# Patient Record
Sex: Male | Born: 1950
Health system: Southern US, Community
[De-identification: ages and names within clinical notes are randomized; demographics above are authoritative.]

## PROBLEM LIST (undated history)

## (undated) DIAGNOSIS — I10 Essential (primary) hypertension: Secondary | ICD-10-CM

## (undated) DIAGNOSIS — E079 Disorder of thyroid, unspecified: Secondary | ICD-10-CM

## (undated) HISTORY — DX: Disorder of thyroid, unspecified: E07.9

## (undated) HISTORY — PX: OTHER SURGICAL HISTORY: SHX169

## (undated) HISTORY — DX: Essential (primary) hypertension: I10

---

## 2009-09-07 ENCOUNTER — Ambulatory Visit: Payer: Self-pay | Admitting: Gastroenterology

## 2010-04-16 ENCOUNTER — Ambulatory Visit: Payer: Self-pay | Admitting: Family Medicine

## 2014-06-14 DIAGNOSIS — S0181XA Laceration without foreign body of other part of head, initial encounter: Secondary | ICD-10-CM | POA: Diagnosis not present

## 2014-06-30 DIAGNOSIS — I10 Essential (primary) hypertension: Secondary | ICD-10-CM | POA: Diagnosis not present

## 2014-06-30 DIAGNOSIS — Z23 Encounter for immunization: Secondary | ICD-10-CM | POA: Diagnosis not present

## 2014-07-28 DIAGNOSIS — J324 Chronic pansinusitis: Secondary | ICD-10-CM | POA: Diagnosis not present

## 2014-09-08 DIAGNOSIS — S00211A Abrasion of right eyelid and periocular area, initial encounter: Secondary | ICD-10-CM | POA: Diagnosis not present

## 2015-01-28 DIAGNOSIS — I10 Essential (primary) hypertension: Secondary | ICD-10-CM | POA: Insufficient documentation

## 2015-01-29 ENCOUNTER — Encounter: Payer: Self-pay | Admitting: Family Medicine

## 2015-01-29 ENCOUNTER — Ambulatory Visit (INDEPENDENT_AMBULATORY_CARE_PROVIDER_SITE_OTHER): Payer: Medicare Other | Admitting: Family Medicine

## 2015-01-29 VITALS — BP 133/78 | HR 96 | Temp 98.9°F | Ht 62.5 in | Wt 142.0 lb

## 2015-01-29 DIAGNOSIS — I10 Essential (primary) hypertension: Secondary | ICD-10-CM | POA: Diagnosis not present

## 2015-01-29 MED ORDER — AMLODIPINE BESYLATE 5 MG PO TABS: 5.0000 mg | ORAL_TABLET | Freq: Every day | ORAL | Status: DC

## 2015-01-29 MED ORDER — BENAZEPRIL HCL 40 MG PO TABS: 40.0000 mg | ORAL_TABLET | Freq: Every day | ORAL | Status: DC

## 2015-01-29 NOTE — Assessment & Plan Note (Signed)
The current medical regimen is effective;  continue present plan and medications.  

## 2015-01-29 NOTE — Progress Notes (Signed)
   BP 133/78 mmHg  Pulse 96  Temp(Src) 98.9 F (37.2 C)  Ht 5' 2.5" (1.588 m)  Wt 142 lb (64.411 kg)  BMI 25.54 kg/m2  SpO2 95%   Subjective:    Patient ID: Jerry Frey, male    DOB: Oct 31, 1950, 64 y.o.   MRN: [id number]  HPI: Jerry Frey is a 64 y.o. male  Chief Complaint  Patient presents with  . Hypertension  doing well on bp long term, takes meds every day with no side effects  Relevant past medical, surgical, family and social history reviewed and updated as indicated. Interim medical history since our last visit reviewed. Allergies and medications reviewed and updated.  Review of Systems  Constitutional: Negative.   Respiratory: Negative.   Cardiovascular: Negative.     Per HPI unless specifically indicated above     Objective:    BP 133/78 mmHg  Pulse 96  Temp(Src) 98.9 F (37.2 C)  Ht 5' 2.5" (1.588 m)  Wt 142 lb (64.411 kg)  BMI 25.54 kg/m2  SpO2 95%  Wt Readings from Last 3 Encounters:  01/29/15 142 lb (64.411 kg)  07/28/14 151 lb (68.493 kg)    Physical Exam  Constitutional: He is oriented to person, place, and time. He appears well-developed and well-nourished. No distress.  HENT:  Head: Normocephalic and atraumatic.  Right Ear: Hearing normal.  Left Ear: Hearing normal.  Nose: Nose normal.  Eyes: Conjunctivae and lids are normal. Right eye exhibits no discharge. Left eye exhibits no discharge. No scleral icterus.  Cardiovascular: Normal rate, regular rhythm and normal heart sounds.   Pulmonary/Chest: Effort normal and breath sounds normal. No respiratory distress.  Musculoskeletal: Normal range of motion. He exhibits no edema.  Neurological: He is alert and oriented to person, place, and time.  Skin: Skin is intact. No rash noted.  Psychiatric: He has a normal mood and affect. His speech is normal and behavior is normal. Judgment and thought content normal. Cognition and memory are normal.    No results found for this or any  previous visit.    Assessment & Plan:   Problem List Items Addressed This Visit      Cardiovascular and Mediastinum   Hypertension - Primary    The current medical regimen is effective;  continue present plan and medications.       Relevant Medications   amLODipine (NORVASC) 5 MG tablet   benazepril (LOTENSIN) 40 MG tablet   Other Relevant Orders   Basic metabolic panel       Follow up plan: Return for Physical Exam.

## 2015-01-30 LAB — BASIC METABOLIC PANEL
BUN / CREAT RATIO: 18 (ref 10–22)
BUN: 16 mg/dL (ref 8–27)
CHLORIDE: 101 mmol/L (ref 97–108)
CO2: 22 mmol/L (ref 18–29)
CREATININE: 0.91 mg/dL (ref 0.76–1.27)
Calcium: 10 mg/dL (ref 8.6–10.2)
GFR calc Af Amer: 102 mL/min/{1.73_m2} (ref >59)
GFR, EST NON AFRICAN AMERICAN: 88 mL/min/{1.73_m2} (ref >59)
GLUCOSE: 146 mg/dL — AB (ref 65–99)
Potassium: 4.1 mmol/L (ref 3.5–5.2)
Sodium: 142 mmol/L (ref 134–144)

## 2015-04-30 ENCOUNTER — Ambulatory Visit (INDEPENDENT_AMBULATORY_CARE_PROVIDER_SITE_OTHER): Payer: Medicare Other | Admitting: Family Medicine

## 2015-04-30 ENCOUNTER — Encounter: Payer: Self-pay | Admitting: Family Medicine

## 2015-04-30 VITALS — BP 130/88 | HR 102 | Temp 99.6°F | Ht 62.5 in | Wt 141.4 lb

## 2015-04-30 DIAGNOSIS — I10 Essential (primary) hypertension: Secondary | ICD-10-CM | POA: Diagnosis not present

## 2015-04-30 DIAGNOSIS — F5221 Male erectile disorder: Secondary | ICD-10-CM | POA: Insufficient documentation

## 2015-04-30 DIAGNOSIS — Z0001 Encounter for general adult medical examination with abnormal findings: Secondary | ICD-10-CM | POA: Diagnosis not present

## 2015-04-30 DIAGNOSIS — Z125 Encounter for screening for malignant neoplasm of prostate: Secondary | ICD-10-CM

## 2015-04-30 DIAGNOSIS — N400 Enlarged prostate without lower urinary tract symptoms: Secondary | ICD-10-CM | POA: Diagnosis not present

## 2015-04-30 DIAGNOSIS — Z0000 Encounter for general adult medical examination without abnormal findings: Secondary | ICD-10-CM

## 2015-04-30 DIAGNOSIS — F528 Other sexual dysfunction not due to a substance or known physiological condition: Secondary | ICD-10-CM

## 2015-04-30 DIAGNOSIS — R0989 Other specified symptoms and signs involving the circulatory and respiratory systems: Secondary | ICD-10-CM | POA: Diagnosis not present

## 2015-04-30 MED ORDER — ASPIRIN EC 81 MG PO TBEC
81.0000 mg | DELAYED_RELEASE_TABLET | Freq: Every day | ORAL | Status: DC
Start: 2015-04-30 — End: 2016-10-31

## 2015-04-30 NOTE — Assessment & Plan Note (Signed)
The current medical regimen is effective;  continue present plan and medications.  

## 2015-04-30 NOTE — Assessment & Plan Note (Signed)
Discussed care and concern Discussed signs and symptoms of stroke, and calling 911 Aspirin will take every other day is when taking every day lead to much Appointment for carotid ultrasound at  [facility] Further workup pending ultrasound report.

## 2015-04-30 NOTE — Assessment & Plan Note (Signed)
Discuss lifestyle and work  diet  Nutrition Offer meds such as viagra

## 2015-04-30 NOTE — Progress Notes (Signed)
BP 130/88 mmHg  Pulse 102  Temp(Src) 99.6 F (37.6 C)  Ht 5' 2.5" (1.588 m)  Wt 141 lb 6.4 oz (64.139 kg)  BMI 25.43 kg/m2  SpO2 96%   Subjective:    Patient ID: Jerry Frey, male    DOB: [birth year], 65 y.o.   MRN: [id number]  HPI: Jerry Frey is a 64 y.o. male  Chief Complaint  Patient presents with  . Annual Exam   patient all in all doing well blood pressure doing stable has noticed some change in sexual function. Work stable with 7-8 hours of sleep at night nutrition okay. On review no sleep apnea symptoms.  Relevant past medical, surgical, family and social history reviewed and updated as indicated. Interim medical history since our last visit reviewed. Allergies and medications reviewed and updated.  Review of Systems  Constitutional: Negative.   HENT: Negative.   Eyes: Negative.   Respiratory: Negative.   Cardiovascular: Negative.   Gastrointestinal: Negative.   Endocrine: Negative.   Genitourinary: Negative.   Musculoskeletal: Negative.   Skin: Negative.   Allergic/Immunologic: Negative.   Neurological: Negative.   Hematological: Negative.   Psychiatric/Behavioral: Negative.     Per HPI unless specifically indicated above     Objective:    BP 130/88 mmHg  Pulse 102  Temp(Src) 99.6 F (37.6 C)  Ht 5' 2.5" (1.588 m)  Wt 141 lb 6.4 oz (64.139 kg)  BMI 25.43 kg/m2  SpO2 96%  Wt Readings from Last 3 Encounters:  04/30/15 141 lb 6.4 oz (64.139 kg)  01/29/15 142 lb (64.411 kg)  07/28/14 151 lb (68.493 kg)    Physical Exam  Constitutional: He is oriented to person, place, and time. He appears well-developed and well-nourished.  HENT:  Head: Normocephalic and atraumatic.  Right Ear: External ear normal.  Left Ear: External ear normal.  Eyes: Conjunctivae and EOM are normal. Pupils are equal, round, and reactive to light.  Neck: Normal range of motion. Neck supple.  Soft bruits heard right neck  Cardiovascular: Normal rate, regular  rhythm, normal heart sounds and intact distal pulses.   Pulmonary/Chest: Effort normal and breath sounds normal.  Abdominal: Soft. Bowel sounds are normal. There is no splenomegaly or hepatomegaly.  Genitourinary: Rectum normal and penis normal.  Prostate enlarged  Musculoskeletal: Normal range of motion.  Neurological: He is alert and oriented to person, place, and time. He has normal reflexes.  Skin: No rash noted. No erythema.  Psychiatric: He has a normal mood and affect. His behavior is normal. Judgment and thought content normal.    Results for orders placed or performed in visit on 32/20/25  Basic metabolic panel  Result Value Ref Range   Glucose 146 (H) 65 - 99 mg/dL   BUN 16 8 - 27 mg/dL   Creatinine, Ser 0.91 0.76 - 1.27 mg/dL   GFR calc non Af Amer 88 >59 mL/min/1.73   GFR calc Af Amer 102 >59 mL/min/1.73   BUN/Creatinine Ratio 18 10 - 22   Sodium 142 134 - 144 mmol/L   Potassium 4.1 3.5 - 5.2 mmol/L   Chloride 101 97 - 108 mmol/L   CO2 22 18 - 29 mmol/L   Calcium 10.0 8.6 - 10.2 mg/dL      Assessment & Plan:   Problem List Items Addressed This Visit      Cardiovascular and Mediastinum   Hypertension - Primary    The current medical regimen is effective;  continue present plan and medications.  Relevant Medications   aspirin EC 81 MG tablet   Other Relevant Orders   CBC with Differential/Platelet   Lipid panel   Comprehensive metabolic panel   Urinalysis, Routine w reflex microscopic (not at [facility])   TSH     Genitourinary   BPH (benign prostatic hyperplasia)   Relevant Orders   PSA     Other   ED (erectile dysfunction) of non-organic origin    Discuss lifestyle and work  diet  Nutrition Offer meds such as viagra        Relevant Orders   Lipid panel   Right carotid bruit    Discussed care and concern Discussed signs and symptoms of stroke, and calling 911 Aspirin will take every other day is when taking every day lead to  much Appointment for carotid ultrasound at [facility] Further workup pending ultrasound report.      Relevant Orders   Ambulatory referral to Vascular Surgery   Lipid panel   TSH    Other Visit Diagnoses    PE (physical exam), annual        Relevant Orders    CBC with Differential/Platelet    Lipid panel    Comprehensive metabolic panel    PSA    Urinalysis, Routine w reflex microscopic (not at [facility])    TSH       Medicare wellness metrics all discussed and reviewed. Follow up plan: Return in about 6 months (around 10/28/2015), or if symptoms worsen or fail to improve, for Pending workup and routine hypertension follow-up with BMP.Marland Kitchen

## 2015-05-01 LAB — LIPID PANEL
CHOLESTEROL TOTAL: 213 mg/dL — AB (ref 100–199)
Chol/HDL Ratio: 3.7 ratio units (ref 0.0–5.0)
HDL: 57 mg/dL (ref >39)
LDL Calculated: 102 mg/dL — ABNORMAL HIGH (ref 0–99)
Triglycerides: 269 mg/dL — ABNORMAL HIGH (ref 0–149)
VLDL CHOLESTEROL CAL: 54 mg/dL — AB (ref 5–40)

## 2015-05-01 LAB — CBC WITH DIFFERENTIAL/PLATELET
BASOS ABS: 0 10*3/uL (ref 0.0–0.2)
Basos: 1 %
EOS (ABSOLUTE): 0.1 10*3/uL (ref 0.0–0.4)
Eos: 2 %
HEMOGLOBIN: 14.6 g/dL (ref 12.6–17.7)
Hematocrit: 45.7 % (ref 37.5–51.0)
IMMATURE GRANS (ABS): 0 10*3/uL (ref 0.0–0.1)
IMMATURE GRANULOCYTES: 0 %
LYMPHS: 34 %
Lymphocytes Absolute: 1.8 10*3/uL (ref 0.7–3.1)
MCH: 30 pg (ref 26.6–33.0)
MCHC: 31.9 g/dL (ref 31.5–35.7)
MCV: 94 fL (ref 79–97)
MONOCYTES: 7 %
Monocytes Absolute: 0.4 10*3/uL (ref 0.1–0.9)
NEUTROS ABS: 2.9 10*3/uL (ref 1.4–7.0)
NEUTROS PCT: 56 %
Platelets: 144 10*3/uL — ABNORMAL LOW (ref 150–379)
RBC: 4.86 x10E6/uL (ref 4.14–5.80)
RDW: 15 % (ref 12.3–15.4)
WBC: 5.2 10*3/uL (ref 3.4–10.8)

## 2015-05-01 LAB — URINALYSIS, ROUTINE W REFLEX MICROSCOPIC
Bilirubin, UA: NEGATIVE
GLUCOSE, UA: NEGATIVE
Ketones, UA: NEGATIVE
NITRITE UA: NEGATIVE
PH UA: 5.5 (ref 5.0–7.5)
Protein, UA: NEGATIVE
Specific Gravity, UA: 1.02 (ref 1.005–1.030)
Urobilinogen, Ur: 0.2 mg/dL (ref 0.2–1.0)

## 2015-05-01 LAB — COMPREHENSIVE METABOLIC PANEL
ALK PHOS: 40 IU/L (ref 39–117)
ALT: 36 IU/L (ref 0–44)
AST: 29 IU/L (ref 0–40)
Albumin/Globulin Ratio: 1.5 (ref 1.1–2.5)
Albumin: 4.3 g/dL (ref 3.6–4.8)
BUN / CREAT RATIO: 20 (ref 10–22)
BUN: 19 mg/dL (ref 8–27)
Bilirubin Total: 0.5 mg/dL (ref 0.0–1.2)
CHLORIDE: 102 mmol/L (ref 97–108)
CO2: 23 mmol/L (ref 18–29)
CREATININE: 0.96 mg/dL (ref 0.76–1.27)
Calcium: 9.4 mg/dL (ref 8.6–10.2)
GFR calc Af Amer: 95 mL/min/{1.73_m2} (ref >59)
GFR calc non Af Amer: 83 mL/min/{1.73_m2} (ref >59)
GLOBULIN, TOTAL: 2.8 g/dL (ref 1.5–4.5)
Glucose: 112 mg/dL — ABNORMAL HIGH (ref 65–99)
Potassium: 3.8 mmol/L (ref 3.5–5.2)
Sodium: 140 mmol/L (ref 134–144)
Total Protein: 7.1 g/dL (ref 6.0–8.5)

## 2015-05-01 LAB — TSH: TSH: 0.476 u[IU]/mL (ref 0.450–4.500)

## 2015-05-01 LAB — MICROSCOPIC EXAMINATION

## 2015-05-01 LAB — PSA: Prostate Specific Ag, Serum: 1 ng/mL (ref 0.0–4.0)

## 2015-05-04 ENCOUNTER — Encounter: Payer: Self-pay | Admitting: Family Medicine

## 2015-05-13 DIAGNOSIS — R0989 Other specified symptoms and signs involving the circulatory and respiratory systems: Secondary | ICD-10-CM | POA: Diagnosis not present

## 2015-10-28 ENCOUNTER — Ambulatory Visit (INDEPENDENT_AMBULATORY_CARE_PROVIDER_SITE_OTHER): Payer: Medicare Other | Admitting: Family Medicine

## 2015-10-28 ENCOUNTER — Encounter: Payer: Self-pay | Admitting: Family Medicine

## 2015-10-28 VITALS — BP 141/97 | HR 84 | Temp 98.3°F | Ht 63.0 in | Wt 146.0 lb

## 2015-10-28 DIAGNOSIS — I10 Essential (primary) hypertension: Secondary | ICD-10-CM

## 2015-10-28 NOTE — Assessment & Plan Note (Signed)
Poor control today patient will be checking at home if staying up will notify us For further better control

## 2015-10-28 NOTE — Progress Notes (Signed)
BP 141/97 mmHg  Pulse 84  Temp(Src) 98.3 F (36.8 C)  Ht 5\' 3"  (1.6 m)  Wt 146 lb (66.225 kg)  BMI 25.87 kg/m2  SpO2 99%   Subjective:    Patient ID: Jerry Frey, male    DOB: [birth year], 65 y.o.   MRN: [id number]  HPI: Jerry Frey is a 65 y.o. male  Chief Complaint  Patient presents with  . 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 Interim medical history since our last visit reviewed. Allergies and medications reviewed and updated.  Review of Systems  Constitutional: Negative.   Respiratory: Negative.   Cardiovascular: Negative.     Per HPI unless specifically indicated above     Objective:    BP 141/97 mmHg  Pulse 84  Temp(Src) 98.3 F (36.8 C)  Ht 5\' 3"  (1.6 m)  Wt 146 lb (66.225 kg)  BMI 25.87 kg/m2  SpO2 99%  Wt Readings from Last 3 Encounters:  10/28/15 146 lb (66.225 kg)  04/30/15 141 lb 6.4 oz (64.139 kg)  01/29/15 142 lb (64.411 kg)    Physical Exam  Constitutional: He is oriented to person, place, and time. He appears well-developed and well-nourished. No distress.  HENT:  Head: Normocephalic and atraumatic.  Right Ear: Hearing normal.  Left Ear: Hearing normal.  Nose: Nose normal.  Eyes: Conjunctivae and lids are normal. Right eye exhibits no discharge. Left eye exhibits no discharge. No scleral icterus.  Cardiovascular: Normal rate, regular rhythm and normal heart sounds.   Pulmonary/Chest: Effort normal and breath  sounds normal. No respiratory distress.  Musculoskeletal: Normal range of motion.  Neurological: He is alert and oriented to person, place, and time.  Skin: Skin is intact. No rash noted.  Psychiatric: He has a normal mood and affect. His speech is normal and behavior is normal. Judgment and thought content normal. Cognition and memory are normal.    Results for orders placed or performed in visit on 04/30/15  Microscopic Examination  Result Value Ref Range   WBC, UA 6-10 (A) 0 -  5 /hpf   RBC, UA 0-2 0 -  2 /hpf   Epithelial Cells (non renal) 0-10 0 - 10 /hpf   Bacteria, UA Few None seen/Few  CBC with Differential/Platelet  Result Value Ref Range   WBC 5.2 3.4 - 10.8 x10E3/uL   RBC 4.86 4.14 - 5.80 x10E6/uL   Hemoglobin 14.6 12.6 - 17.7 g/dL   Hematocrit 45.7 37.5 - 51.0 %   MCV 94 79 - 97 fL   MCH 30.0 26.6 - 33.0 pg   MCHC 31.9 31.5 - 35.7 g/dL   RDW 15.0 12.3 - 15.4 %   Platelets 144 (L) 150 - 379 x10E3/uL   Neutrophils 56 %   Lymphs 34 %   Monocytes 7 %   Eos 2 %   Basos 1 %   Neutrophils Absolute 2.9 1.4 - 7.0 x10E3/uL   Lymphocytes Absolute 1.8 0.7 - 3.1 x10E3/uL   Monocytes Absolute 0.4 0.1 - 0.9 x10E3/uL  EOS (ABSOLUTE) 0.1 0.0 - 0.4 x10E3/uL   Basophils Absolute 0.0 0.0 - 0.2 x10E3/uL   Immature Granulocytes 0 %   Immature Grans (Abs) 0.0 0.0 - 0.1 x10E3/uL  Lipid panel  Result Value Ref Range   Cholesterol, Total 213 (H) 100 - 199 mg/dL   Triglycerides 269 (H) 0 - 149 mg/dL   HDL 57 >39 mg/dL   VLDL Cholesterol Cal 54 (H) 5 - 40 mg/dL   LDL Calculated 102 (H) 0 - 99 mg/dL   Chol/HDL Ratio 3.7 0.0 - 5.0 ratio units  Comprehensive metabolic panel  Result Value Ref Range   Glucose 112 (H) 65 - 99 mg/dL   BUN 19 8 - 27 mg/dL   Creatinine, Ser 0.96 0.76 - 1.27 mg/dL   GFR calc non Af Amer 83 >59 mL/min/1.73   GFR calc Af Amer 95 >59 mL/min/1.73   BUN/Creatinine Ratio 20 10 - 22   Sodium 140 134 - 144 mmol/L   Potassium 3.8 3.5 - 5.2 mmol/L   Chloride 102  97 - 108 mmol/L   CO2 23 18 - 29 mmol/L   Calcium 9.4 8.6 - 10.2 mg/dL   Total Protein 7.1 6.0 - 8.5 g/dL   Albumin 4.3 3.6 - 4.8 g/dL   Globulin, Total 2.8 1.5 - 4.5 g/dL   Albumin/Globulin Ratio 1.5 1.1 - 2.5   Bilirubin Total 0.5 0.0 - 1.2 mg/dL   Alkaline Phosphatase 40 39 - 117 IU/L   AST 29 0 - 40 IU/L   ALT 36 0 - 44 IU/L  PSA  Result Value Ref Range   Prostate Specific Ag, Serum 1.0 0.0 - 4.0 ng/mL  Urinalysis, Routine w reflex microscopic (not at [facility])  Result Value Ref Range   Specific Gravity, UA 1.020 1.005 - 1.030   pH, UA 5.5 5.0 - 7.5   Color, UA Yellow Yellow   Appearance Ur Clear Clear   Leukocytes, UA Trace (A) Negative   Protein, UA Negative Negative/Trace   Glucose, UA Negative Negative   Ketones, UA Negative Negative   RBC, UA Trace (A) Negative   Bilirubin, UA Negative Negative   Urobilinogen, Ur 0.2 0.2 - 1.0 mg/dL   Nitrite, UA Negative Negative   Microscopic Examination See below:   TSH  Result Value Ref Range   TSH 0.476 0.450 - 4.500 uIU/mL      Assessment & Plan:   Problem List Items Addressed This Visit      Cardiovascular and Mediastinum   Hypertension - Primary    Poor control today patient will be checking at home if staying up will notify us For further better control      Relevant Orders   Basic metabolic panel      Musculoskeletal shoulder area pain discussed care and treatment exercises Follow up plan: Return in about 6 months (around 04/29/2016) for Physical Exam.

## 2015-10-29 ENCOUNTER — Encounter: Payer: Self-pay | Admitting: Family Medicine

## 2015-10-29 LAB — BASIC METABOLIC PANEL
BUN / CREAT RATIO: 23 — AB (ref 10–22)
BUN: 21 mg/dL (ref 8–27)
CHLORIDE: 99 mmol/L (ref 96–106)
CO2: 23 mmol/L (ref 18–29)
Calcium: 9.1 mg/dL (ref 8.6–10.2)
Creatinine, Ser: 0.93 mg/dL (ref 0.76–1.27)
GFR, EST AFRICAN AMERICAN: 99 mL/min/{1.73_m2} (ref >59)
GFR, EST NON AFRICAN AMERICAN: 85 mL/min/{1.73_m2} (ref >59)
Glucose: 94 mg/dL (ref 65–99)
POTASSIUM: 4 mmol/L (ref 3.5–5.2)
Sodium: 139 mmol/L (ref 134–144)

## 2016-04-13 ENCOUNTER — Encounter (INDEPENDENT_AMBULATORY_CARE_PROVIDER_SITE_OTHER): Payer: Self-pay

## 2016-04-21 ENCOUNTER — Other Ambulatory Visit: Payer: Self-pay | Admitting: Family Medicine

## 2016-04-21 DIAGNOSIS — I10 Essential (primary) hypertension: Secondary | ICD-10-CM

## 2016-05-02 ENCOUNTER — Ambulatory Visit (INDEPENDENT_AMBULATORY_CARE_PROVIDER_SITE_OTHER): Payer: Medicare Other | Admitting: Family Medicine

## 2016-05-02 ENCOUNTER — Encounter: Payer: Self-pay | Admitting: Family Medicine

## 2016-05-02 VITALS — BP 128/78 | HR 96 | Temp 98.4°F | Ht 63.3 in | Wt 147.0 lb

## 2016-05-02 DIAGNOSIS — Z0000 Encounter for general adult medical examination without abnormal findings: Secondary | ICD-10-CM | POA: Diagnosis not present

## 2016-05-02 DIAGNOSIS — N400 Enlarged prostate without lower urinary tract symptoms: Secondary | ICD-10-CM | POA: Diagnosis not present

## 2016-05-02 DIAGNOSIS — I10 Essential (primary) hypertension: Secondary | ICD-10-CM

## 2016-05-02 DIAGNOSIS — Z1159 Encounter for screening for other viral diseases: Secondary | ICD-10-CM | POA: Diagnosis not present

## 2016-05-02 DIAGNOSIS — Z23 Encounter for immunization: Secondary | ICD-10-CM | POA: Diagnosis not present

## 2016-05-02 LAB — URINALYSIS, ROUTINE W REFLEX MICROSCOPIC
BILIRUBIN UA: NEGATIVE
GLUCOSE, UA: NEGATIVE
KETONES UA: NEGATIVE
Leukocytes, UA: NEGATIVE
Nitrite, UA: NEGATIVE
PH UA: 6 (ref 5.0–7.5)
PROTEIN UA: NEGATIVE
SPEC GRAV UA: 1.01 (ref 1.005–1.030)
UUROB: 0.2 mg/dL (ref 0.2–1.0)

## 2016-05-02 LAB — MICROSCOPIC EXAMINATION
BACTERIA UA: NONE SEEN
Epithelial Cells (non renal): NONE SEEN /hpf (ref 0–10)
RBC, UA: NONE SEEN /hpf (ref 0–?)
WBC UA: NONE SEEN /HPF (ref 0–?)

## 2016-05-02 MED ORDER — AMLODIPINE BESYLATE 5 MG PO TABS: 5.0000 mg | ORAL_TABLET | Freq: Every day | ORAL | 4 refills | Status: DC

## 2016-05-02 MED ORDER — BENAZEPRIL HCL 40 MG PO TABS: 40.0000 mg | ORAL_TABLET | Freq: Every day | ORAL | 4 refills | Status: DC

## 2016-05-02 NOTE — Addendum Note (Signed)
Addended by: Wynn Maudlin on: 05/02/2016 02:44 PM   Modules accepted: Orders

## 2016-05-02 NOTE — Assessment & Plan Note (Signed)
The current medical regimen is effective;  continue present plan and medications.  

## 2016-05-02 NOTE — Progress Notes (Signed)
BP 128/78 (BP Location: Right Arm, Cuff Size: Small)   Pulse 96   Temp 98.4 F (36.9 C)   Ht 5' 3.3" (1.608 m)   Wt 147 lb (66.7 kg)   SpO2 97%   BMI 25.79 kg/m    Subjective:    Patient ID: Jerry Frey, male    DOB: 1951/03/03, 65 y.o.   MRN: [id number]  HPI: Jerry Frey is a 65 y.o. male  Chief Complaint  Patient presents with  . Annual Exam  AWV metrics met Patient all in all doing well taking blood pressure medications without problems Is concerned has some fatigue gets 7-8 hours of sleep at night eats reasonable diet and no extra stress. Good sleep with no signs or symptoms of sleep apnea. Relevant past medical, surgical, family and social history reviewed and updated as indicated. Interim medical history since our last visit reviewed. Allergies and medications reviewed and updated.  Review of Systems  Constitutional: Positive for fatigue. Negative for activity change, diaphoresis and fever.  HENT: Negative.   Eyes: Negative.   Respiratory: Negative.   Cardiovascular: Negative.   Gastrointestinal: Negative.   Endocrine: Negative.   Genitourinary: Negative.   Musculoskeletal: Negative.   Skin: Negative.   Allergic/Immunologic: Negative.   Neurological: Negative.   Hematological: Negative.   Psychiatric/Behavioral: Negative.     Per HPI unless specifically indicated above     Objective:    BP 128/78 (BP Location: Right Arm, Cuff Size: Small)   Pulse 96   Temp 98.4 F (36.9 C)   Ht 5' 3.3" (1.608 m)   Wt 147 lb (66.7 kg)   SpO2 97%   BMI 25.79 kg/m   Wt Readings from Last 3 Encounters:  05/02/16 147 lb (66.7 kg)  10/28/15 146 lb (66.2 kg)  04/30/15 141 lb 6.4 oz (64.1 kg)    Physical Exam  Constitutional: He is oriented to person, place, and time. He appears well-developed and well-nourished.  HENT:  Head: Normocephalic and atraumatic.  Right Ear: External ear normal.  Left Ear: External ear normal.  Eyes: Conjunctivae and EOM are  normal. Pupils are equal, round, and reactive to light.  Neck: Normal range of motion. Neck supple.  Cardiovascular: Normal rate, regular rhythm, normal heart sounds and intact distal pulses.   Pulmonary/Chest: Effort normal and breath sounds normal.  Abdominal: Soft. Bowel sounds are normal. There is no splenomegaly or hepatomegaly.  Genitourinary: Rectum normal and penis normal.  Genitourinary Comments: Prostate enlarged  Musculoskeletal: Normal range of motion.  Neurological: He is alert and oriented to person, place, and time. He has normal reflexes.  Skin: No rash noted. No erythema.  Psychiatric: He has a normal mood and affect. His behavior is normal. Judgment and thought content normal.    Results for orders placed or performed in visit on 77/41/28  Basic metabolic panel  Result Value Ref Range   Glucose 94 65 - 99 mg/dL   BUN 21 8 - 27 mg/dL   Creatinine, Ser 0.93 0.76 - 1.27 mg/dL   GFR calc non Af Amer 85 >59 mL/min/1.73   GFR calc Af Amer 99 >59 mL/min/1.73   BUN/Creatinine Ratio 23 (H) 10 - 22   Sodium 139 134 - 144 mmol/L   Potassium 4.0 3.5 - 5.2 mmol/L   Chloride 99 96 - 106 mmol/L   CO2 23 18 - 29 mmol/L   Calcium 9.1 8.6 - 10.2 mg/dL      Assessment & Plan:   Problem List Items  Addressed This Visit      Cardiovascular and Mediastinum   Hypertension    The current medical regimen is effective;  continue present plan and medications.       Relevant Medications   benazepril (LOTENSIN) 40 MG tablet   amLODipine (NORVASC) 5 MG tablet   Other Relevant Orders   CBC with Differential/Platelet   TSH     Genitourinary   BPH (benign prostatic hyperplasia)    All in all doing well no medications no real BPH symptoms      Relevant Orders   PSA    Other Visit Diagnoses    Immunization due    -  Primary   Relevant Orders   Flu vaccine HIGH DOSE PF (Fluzone High dose) (Completed)   Well adult exam       Relevant Orders   CBC with Differential/Platelet    Comprehensive metabolic panel   Lipid Panel w/o Chol/HDL Ratio   PSA   TSH   Urinalysis, Routine w reflex microscopic (not at [facility])   Need for hepatitis C screening test       Relevant Orders   Hepatitis C Antibody     For fatigue will check blood work and review  Follow up plan: Return in about 6 months (around 10/31/2016) for BMP.

## 2016-05-02 NOTE — Patient Instructions (Addendum)
Influenza (Flu) Vaccine (Inactivated or Recombinant):  1. Why get vaccinated? Influenza ("flu") is a contagious disease that spreads around the [place] every year, usually between October and May. Flu is caused by influenza viruses, and is spread mainly by coughing, sneezing, and close contact. Anyone can get flu. Flu strikes suddenly and can last several days. Symptoms vary by age, but can include:  fever/chills  sore throat  muscle aches  fatigue  cough  headache  runny or stuffy nose Flu can also lead to pneumonia and blood infections, and cause diarrhea and seizures in children. If you have a medical condition, such as heart or lung disease, flu can make it worse. Flu is more dangerous for some people. Infants and young children, people 65 years of age and older, pregnant women, and people with certain health conditions or a weakened immune system are at greatest risk. Each year thousands of people in the [place] die from flu, and many more are hospitalized. Flu vaccine can:  keep you from getting flu,  make flu less severe if you do get it, and  keep you from spreading flu to your family and other people. 2. Inactivated and recombinant flu vaccines A dose of flu vaccine is recommended every flu season. Children 6 months through 8 years of age may need two doses during the same flu season. Everyone else needs only one dose each flu season. Some inactivated flu vaccines contain a very small amount of a mercury-based preservative called thimerosal. Studies have not shown thimerosal in vaccines to be harmful, but flu vaccines that do not contain thimerosal are available. There is no live flu virus in flu shots. They cannot cause the flu. There are many flu viruses, and they are always changing. Each year a new flu vaccine is made to protect against three or four viruses that are likely to cause disease in the upcoming flu season. But even when the vaccine doesn't exactly  match these viruses, it may still provide some protection. Flu vaccine cannot prevent:  flu that is caused by a virus not covered by the vaccine, or  illnesses that look like flu but are not. It takes about 2 weeks for protection to develop after vaccination, and protection lasts through the flu season. 3. Some people should not get this vaccine Tell the person who is giving you the vaccine:  If you have any severe, life-threatening allergies. If you ever had a life-threatening allergic reaction after a dose of flu vaccine, or have a severe allergy to any part of this vaccine, you may be advised not to get vaccinated. Most, but not all, types of flu vaccine contain a small amount of egg protein.  If you ever had Guillain-Barre Syndrome (also called GBS). Some people with a history of GBS should not get this vaccine. This should be discussed with your doctor.  If you are not feeling well. It is usually okay to get flu vaccine when you have a mild illness, but you might be asked to come back when you feel better. 4. Risks of a vaccine reaction With any medicine, including vaccines, there is a chance of reactions. These are usually mild and go away on their own, but serious reactions are also possible. Most people who get a flu shot do not have any problems with it. Minor problems following a flu shot include:  soreness, redness, or swelling where the shot was given  hoarseness  sore, red or itchy eyes  cough    fever  aches  headache  itching  fatigue If these problems occur, they usually begin soon after the shot and last 1 or 2 days. More serious problems following a flu shot can include the following:  There may be a small increased risk of Guillain-Barre Syndrome (GBS) after inactivated flu vaccine. This risk has been estimated at 1 or 2 additional cases per million people vaccinated. This is much lower than the risk of severe complications from flu, which can be prevented by  flu vaccine.  Young children who get the flu shot along with pneumococcal vaccine (PCV13) and/or DTaP vaccine at the same time might be slightly more likely to have a seizure caused by fever. Ask your doctor for more information. Tell your doctor if a child who is getting flu vaccine has ever had a seizure. Problems that could happen after any injected vaccine:  People sometimes faint after a medical procedure, including vaccination. Sitting or lying down for about 15 minutes can help prevent fainting, and injuries caused by a fall. Tell your doctor if you feel dizzy, or have vision changes or ringing in the ears.  Some people get severe pain in the shoulder and have difficulty moving the arm where a shot was given. This happens very rarely.  Any medication can cause a severe allergic reaction. Such reactions from a vaccine are very rare, estimated at about 1 in a million doses, and would happen within a few minutes to a few hours after the vaccination. As with any medicine, there is a very remote chance of a vaccine causing a serious injury or death. The safety of vaccines is always being monitored. For more information, visit: www.cdc.gov/vaccinesafety/ 5. What if there is a serious reaction? What should I look for?  Look for anything that concerns you, such as signs of a severe allergic reaction, very high fever, or unusual behavior. Signs of a severe allergic reaction can include hives, swelling of the face and throat, difficulty breathing, a fast heartbeat, dizziness, and weakness. These would start a few minutes to a few hours after the vaccination. What should I do?  If you think it is a severe allergic reaction or other emergency that can't wait, call 9-1-1 and get the person to the nearest hospital. Otherwise, call your doctor.  Reactions should be reported to the Vaccine Adverse Event Reporting System (VAERS). Your doctor should file this report, or you can do it yourself through the  VAERS web site at www.vaers.hhs.gov, or by calling 1-800-822-7967. VAERS does not give medical advice. 6. The National Vaccine Injury Compensation Program The National Vaccine Injury Compensation Program (VICP) is a federal program that was created to compensate people who may have been injured by certain vaccines. Persons who believe they may have been injured by a vaccine can learn about the program and about filing a claim by calling 1-800-338-2382 or visiting the VICP website at www.hrsa.gov/vaccinecompensation. There is a time limit to file a claim for compensation. 7. How can I learn more?  Ask your healthcare provider. He or she can give you the vaccine package insert or suggest other sources of information.  Call your local or state health department.  Contact the Centers for Disease Control and Prevention (CDC):  Call 1-800-232-4636 (1-800-CDC-INFO) or  Visit CDC's website at www.cdc.gov/flu Vaccine Information Statement Inactivated Influenza Vaccine (03/07/2014)   This information is not intended to replace advice given to you by your health care provider. Make sure you discuss any questions you have with   your health care provider.   Document Released: 05/12/2006 Document Revised: 08/08/2014 Document Reviewed: 03/10/2014 Elsevier Interactive Patient Education 2016 Else[employer] Pneumococcal Conjugate Vaccine (PCV13)  1. Why get vaccinated? Vaccination can protect both children and adults from pneumococcal disease. Pneumococcal disease is caused by bacteria that can spread from person to person through close contact. It can cause ear infections, and it can also lead to more serious infections of the:  Lungs (pneumonia),  Blood (bacteremia), and  Covering of the brain and spinal cord (meningitis). Pneumococcal pneumonia is most common among adults. Pneumococcal meningitis can cause deafness and brain damage, and it kills about 1 child in 10 who get it. Anyone can get  pneumococcal disease, but children under 2 years of age and adults 65 years and older, people with certain medical conditions, and cigarette smokers are at the highest risk. Before there was a vaccine, the [place] saw:  more than 700 cases of meningitis,  about 13,000 blood infections,  about 5 million ear infections, and  about 200 deaths in children under 5 each year from pneumococcal disease. Since vaccine became available, severe pneumococcal disease in these children has fallen by 88%. About 18,000 older adults die of pneumococcal disease each year in the [place]. Treatment of pneumococcal infections with penicillin and other drugs is not as effective as it used to be, because some strains of the disease have become resistant to these drugs. This makes prevention of the disease, through vaccination, even more important. 2. PCV13 vaccine Pneumococcal conjugate vaccine (called PCV13) protects against 13 types of pneumococcal bacteria. PCV13 is routinely given to children at 2, 4, 6, and 12-15 months of age. It is also recommended for children and adults 2 to 64 years of age with certain health conditions, and for all adults 65 years of age and older. Your doctor can give you details. 3. Some people should not get this vaccine Anyone who has ever had a life-threatening allergic reaction to a dose of this vaccine, to an earlier pneumococcal vaccine called PCV7, or to any vaccine containing diphtheria toxoid (for example, DTaP), should not get PCV13. Anyone with a severe allergy to any component of PCV13 should not get the vaccine. Tell your doctor if the person being vaccinated has any severe allergies. If the person scheduled for vaccination is not feeling well, your healthcare provider might decide to reschedule the shot on another day. 4. Risks of a vaccine reaction With any medicine, including vaccines, there is a chance of reactions. These are usually mild and go away on their  own, but serious reactions are also possible. Problems reported following PCV13 varied by age and dose in the series. The most common problems reported among children were:  About half became drowsy after the shot, had a temporary loss of appetite, or had redness or tenderness where the shot was given.  About 1 out of 3 had swelling where the shot was given.  About 1 out of 3 had a mild fever, and about 1 in 20 had a fever over 102.2F.  Up to about 8 out of 10 became fussy or irritable. Adults have reported pain, redness, and swelling where the shot was given; also mild fever, fatigue, headache, chills, or muscle pain. Young children who get PCV13 along with inactivated flu vaccine at the same time may be at increased risk for seizures caused by fever. Ask your doctor for more information. Problems that could happen after any vaccine:  People sometimes faint after   a medical procedure, including vaccination. Sitting or lying down for about 15 minutes can help prevent fainting, and injuries caused by a fall. Tell your doctor if you feel dizzy, or have vision changes or ringing in the ears.  Some older children and adults get severe pain in the shoulder and have difficulty moving the arm where a shot was given. This happens very rarely.  Any medication can cause a severe allergic reaction. Such reactions from a vaccine are very rare, estimated at about 1 in a million doses, and would happen within a few minutes to a few hours after the vaccination. As with any medicine, there is a very small chance of a vaccine causing a serious injury or death. The safety of vaccines is always being monitored. For more information, visit: www.cdc.gov/vaccinesafety/ 5. What if there is a serious reaction? What should I look for?  Look for anything that concerns you, such as signs of a severe allergic reaction, very high fever, or unusual behavior. Signs of a severe allergic reaction can include hives, swelling  of the face and throat, difficulty breathing, a fast heartbeat, dizziness, and weakness-usually within a few minutes to a few hours after the vaccination. What should I do?  If you think it is a severe allergic reaction or other emergency that can't wait, call 9-1-1 or get the person to the nearest hospital. Otherwise, call your doctor. Reactions should be reported to the Vaccine Adverse Event Reporting System (VAERS). Your doctor should file this report, or you can do it yourself through the VAERS web site at www.vaers.hhs.gov, or by calling 1-800-822-7967. VAERS does not give medical advice. 6. The National Vaccine Injury Compensation Program The National Vaccine Injury Compensation Program (VICP) is a federal program that was created to compensate people who may have been injured by certain vaccines. Persons who believe they may have been injured by a vaccine can learn about the program and about filing a claim by calling 1-800-338-2382 or visiting the VICP website at www.hrsa.gov/vaccinecompensation. There is a time limit to file a claim for compensation. 7. How can I learn more?  Ask your healthcare provider. He or she can give you the vaccine package insert or suggest other sources of information.  Call your local or state health department.  Contact the Centers for Disease Control and Prevention (CDC):  Call 1-800-232-4636 (1-800-CDC-INFO) or  Visit CDC's website at www.cdc.gov/vaccines Vaccine Information Statement PCV13 Vaccine (06/05/2014)   This information is not intended to replace advice given to you by your health care provider. Make sure you discuss any questions you have with your health care provider.   Document Released: 05/15/2006 Document Revised: 08/08/2014 Document Reviewed: 06/12/2014 Elsevier Interactive Patient Education 2016 Elsevier Inc.  

## 2016-05-02 NOTE — Assessment & Plan Note (Signed)
All in all doing well no medications no real BPH symptoms

## 2016-05-03 ENCOUNTER — Telehealth: Payer: Self-pay | Admitting: Family Medicine

## 2016-05-03 DIAGNOSIS — R7989 Other specified abnormal findings of blood chemistry: Secondary | ICD-10-CM

## 2016-05-03 LAB — COMPREHENSIVE METABOLIC PANEL
ALK PHOS: 40 IU/L (ref 39–117)
ALT: 31 IU/L (ref 0–44)
AST: 37 IU/L (ref 0–40)
Albumin/Globulin Ratio: 1.4 (ref 1.2–2.2)
Albumin: 4.2 g/dL (ref 3.6–4.8)
BUN/Creatinine Ratio: 20 (ref 10–24)
BUN: 19 mg/dL (ref 8–27)
Bilirubin Total: 0.3 mg/dL (ref 0.0–1.2)
CO2: 26 mmol/L (ref 18–29)
CREATININE: 0.97 mg/dL (ref 0.76–1.27)
Calcium: 9 mg/dL (ref 8.6–10.2)
Chloride: 100 mmol/L (ref 96–106)
GFR calc Af Amer: 94 mL/min/{1.73_m2} (ref >59)
GFR calc non Af Amer: 81 mL/min/{1.73_m2} (ref >59)
GLOBULIN, TOTAL: 3 g/dL (ref 1.5–4.5)
Glucose: 108 mg/dL — ABNORMAL HIGH (ref 65–99)
POTASSIUM: 3.7 mmol/L (ref 3.5–5.2)
SODIUM: 141 mmol/L (ref 134–144)
Total Protein: 7.2 g/dL (ref 6.0–8.5)

## 2016-05-03 LAB — TSH: TSH: 0.291 u[IU]/mL — AB (ref 0.450–4.500)

## 2016-05-03 LAB — CBC WITH DIFFERENTIAL/PLATELET
BASOS ABS: 0 10*3/uL (ref 0.0–0.2)
Basos: 1 %
EOS (ABSOLUTE): 0.2 10*3/uL (ref 0.0–0.4)
EOS: 3 %
HEMATOCRIT: 43 % (ref 37.5–51.0)
Hemoglobin: 14.3 g/dL (ref 12.6–17.7)
Immature Grans (Abs): 0 10*3/uL (ref 0.0–0.1)
Immature Granulocytes: 0 %
Lymphocytes Absolute: 1.3 10*3/uL (ref 0.7–3.1)
Lymphs: 24 %
MCH: 30.6 pg (ref 26.6–33.0)
MCHC: 33.3 g/dL (ref 31.5–35.7)
MCV: 92 fL (ref 79–97)
MONOS ABS: 0.6 10*3/uL (ref 0.1–0.9)
Monocytes: 11 %
Neutrophils Absolute: 3.5 10*3/uL (ref 1.4–7.0)
Neutrophils: 61 %
Platelets: 157 10*3/uL (ref 150–379)
RBC: 4.68 x10E6/uL (ref 4.14–5.80)
RDW: 14.7 % (ref 12.3–15.4)
WBC: 5.5 10*3/uL (ref 3.4–10.8)

## 2016-05-03 LAB — LIPID PANEL W/O CHOL/HDL RATIO
Cholesterol, Total: 208 mg/dL — ABNORMAL HIGH (ref 100–199)
HDL: 55 mg/dL (ref >39)
LDL Calculated: 80 mg/dL (ref 0–99)
Triglycerides: 366 mg/dL — ABNORMAL HIGH (ref 0–149)
VLDL Cholesterol Cal: 73 mg/dL — ABNORMAL HIGH (ref 5–40)

## 2016-05-03 LAB — HEPATITIS C ANTIBODY

## 2016-05-03 LAB — PSA: Prostate Specific Ag, Serum: 0.6 ng/mL (ref 0.0–4.0)

## 2016-05-03 NOTE — Telephone Encounter (Signed)
-----   Message from Wynn Maudlin, [place] sent at 05/03/2016  4:48 PM EDT ----- labs

## 2016-05-03 NOTE — Telephone Encounter (Signed)
Phone call Discussed with patient low TSH patient will come back in 2-4 weeks to recheck TSH

## 2016-05-05 ENCOUNTER — Other Ambulatory Visit: Payer: Medicare Other

## 2016-05-05 ENCOUNTER — Other Ambulatory Visit: Payer: Self-pay

## 2016-05-05 DIAGNOSIS — R7989 Other specified abnormal findings of blood chemistry: Secondary | ICD-10-CM

## 2016-05-05 DIAGNOSIS — R946 Abnormal results of thyroid function studies: Secondary | ICD-10-CM | POA: Diagnosis not present

## 2016-05-06 LAB — TSH: TSH: 0.596 u[IU]/mL (ref 0.450–4.500)

## 2016-05-09 ENCOUNTER — Encounter: Payer: Self-pay | Admitting: Family Medicine

## 2016-10-31 ENCOUNTER — Ambulatory Visit (INDEPENDENT_AMBULATORY_CARE_PROVIDER_SITE_OTHER): Payer: Medicare Other | Admitting: Family Medicine

## 2016-10-31 ENCOUNTER — Encounter: Payer: Self-pay | Admitting: Family Medicine

## 2016-10-31 VITALS — BP 111/74 | HR 86 | Temp 98.2°F | Ht 64.0 in | Wt 150.0 lb

## 2016-10-31 DIAGNOSIS — I10 Essential (primary) hypertension: Secondary | ICD-10-CM | POA: Diagnosis not present

## 2016-10-31 DIAGNOSIS — E079 Disorder of thyroid, unspecified: Secondary | ICD-10-CM | POA: Diagnosis not present

## 2016-10-31 NOTE — Assessment & Plan Note (Signed)
The current medical regimen is effective;  continue present plan and medications.  

## 2016-10-31 NOTE — Progress Notes (Signed)
   BP 111/74 (BP Location: Left Arm, Patient Position: Sitting, Cuff Size: Normal)   Pulse 86   Temp 98.2 F (36.8 C)   Ht 5\' 4"  (1.626 m)   Wt 150 lb (68 kg)   SpO2 95%   BMI 25.75 kg/m    Subjective:    Patient ID: Jerry Frey, male    DOB: 29-Jul-1951, 66 y.o.   MRN: [id number]  HPI: Jerry Frey is a 66 y.o. male  Chief Complaint  Patient presents with  . Follow-up  . Hypertension  . Thyroid Problem   Patient doing well no complaints taking blood pressure medicines without problems or issues no side effects no edema. Thyroid no complaints no issues. We will recheck to confirm staying normal Relevant past medical, surgical, family and social history reviewed and updated as indicated. Interim medical history since our last visit reviewed. Allergies and medications reviewed and updated.  Review of Systems  Constitutional: Negative.   Respiratory: Negative.   Cardiovascular: Negative.     Per HPI unless specifically indicated above     Objective:    BP 111/74 (BP Location: Left Arm, Patient Position: Sitting, Cuff Size: Normal)   Pulse 86   Temp 98.2 F (36.8 C)   Ht 5\' 4"  (1.626 m)   Wt 150 lb (68 kg)   SpO2 95%   BMI 25.75 kg/m   Wt Readings from Last 3 Encounters:  10/31/16 150 lb (68 kg)  05/02/16 147 lb (66.7 kg)  10/28/15 146 lb (66.2 kg)    Physical Exam  Constitutional: He is oriented to person, place, and time. He appears well-developed and well-nourished.  HENT:  Head: Normocephalic and atraumatic.  Eyes: Conjunctivae and EOM are normal.  Neck: Normal range of motion.  Cardiovascular: Normal rate, regular rhythm and normal heart sounds.   Pulmonary/Chest: Effort normal and breath sounds normal.  Musculoskeletal: Normal range of motion.  Neurological: He is alert and oriented to person, place, and time.  Skin: No erythema.  Psychiatric: He has a normal mood and affect. His behavior is normal. Judgment and thought content normal.     Results for orders placed or performed in visit on 05/05/16  TSH  Result Value Ref Range   TSH 0.596 0.450 - 4.500 uIU/mL      Assessment & Plan:   Problem List Items Addressed This Visit      Cardiovascular and Mediastinum   Hypertension - Primary    The current medical regimen is effective;  continue present plan and medications.       Relevant Orders   Basic metabolic panel    Other Visit Diagnoses    Thyroid disease       Rechecking TSH today   Relevant Orders   TSH       Follow up plan: Return in about 6 months (around 05/02/2017) for Physical Exam.

## 2016-11-01 LAB — TSH: TSH: 0.007 u[IU]/mL — ABNORMAL LOW (ref 0.450–4.500)

## 2016-11-01 LAB — BASIC METABOLIC PANEL
BUN / CREAT RATIO: 24 (ref 10–24)
BUN: 20 mg/dL (ref 8–27)
CO2: 24 mmol/L (ref 18–29)
CREATININE: 0.84 mg/dL (ref 0.76–1.27)
Calcium: 9.3 mg/dL (ref 8.6–10.2)
Chloride: 102 mmol/L (ref 96–106)
GFR, EST AFRICAN AMERICAN: 105 mL/min/{1.73_m2} (ref >59)
GFR, EST NON AFRICAN AMERICAN: 91 mL/min/{1.73_m2} (ref >59)
Glucose: 131 mg/dL — ABNORMAL HIGH (ref 65–99)
Potassium: 4.2 mmol/L (ref 3.5–5.2)
Sodium: 142 mmol/L (ref 134–144)

## 2016-11-02 ENCOUNTER — Telehealth: Payer: Self-pay | Admitting: Family Medicine

## 2016-11-02 DIAGNOSIS — R7989 Other specified abnormal findings of blood chemistry: Secondary | ICD-10-CM

## 2016-11-02 DIAGNOSIS — R7309 Other abnormal glucose: Secondary | ICD-10-CM

## 2016-11-02 NOTE — Telephone Encounter (Signed)
Phone call Discussed with patient suppressed TSH elevated glucose patient will come back later this month for repeat TSH and thyroid panel. We will also check hemoglobin A1c.

## 2016-11-21 ENCOUNTER — Other Ambulatory Visit: Payer: Medicare Other

## 2016-11-21 DIAGNOSIS — R7309 Other abnormal glucose: Secondary | ICD-10-CM | POA: Diagnosis not present

## 2016-11-21 DIAGNOSIS — R7989 Other specified abnormal findings of blood chemistry: Secondary | ICD-10-CM

## 2016-11-21 DIAGNOSIS — R946 Abnormal results of thyroid function studies: Secondary | ICD-10-CM | POA: Diagnosis not present

## 2016-11-22 ENCOUNTER — Telehealth: Payer: Self-pay | Admitting: Family Medicine

## 2016-11-22 DIAGNOSIS — E0590 Thyrotoxicosis, unspecified without thyrotoxic crisis or storm: Secondary | ICD-10-CM | POA: Insufficient documentation

## 2016-11-22 LAB — T3: T3 TOTAL: 84 ng/dL (ref 71–180)

## 2016-11-22 LAB — TSH: TSH: 0.296 u[IU]/mL — ABNORMAL LOW (ref 0.450–4.500)

## 2016-11-22 LAB — HGB A1C W/O EAG: Hgb A1c MFr Bld: 5.7 % — ABNORMAL HIGH (ref 4.8–5.6)

## 2016-11-22 LAB — T4: T4 TOTAL: 3.9 ug/dL — AB (ref 4.5–12.0)

## 2016-11-22 NOTE — Telephone Encounter (Signed)
Phone call Discussed with patient TSH still suppressed but not as much so will refer to endocrinology to further evaluate for hyperthyroid conditions. Patient indicates he understands and will comply.

## 2017-01-09 DIAGNOSIS — E0590 Thyrotoxicosis, unspecified without thyrotoxic crisis or storm: Secondary | ICD-10-CM | POA: Diagnosis not present

## 2017-01-23 DIAGNOSIS — E038 Other specified hypothyroidism: Secondary | ICD-10-CM | POA: Diagnosis not present

## 2017-03-21 DIAGNOSIS — E221 Hyperprolactinemia: Secondary | ICD-10-CM | POA: Diagnosis not present

## 2017-03-21 DIAGNOSIS — E038 Other specified hypothyroidism: Secondary | ICD-10-CM | POA: Diagnosis not present

## 2017-03-28 DIAGNOSIS — E038 Other specified hypothyroidism: Secondary | ICD-10-CM | POA: Insufficient documentation

## 2017-03-28 DIAGNOSIS — E221 Hyperprolactinemia: Secondary | ICD-10-CM | POA: Diagnosis not present

## 2017-04-13 ENCOUNTER — Ambulatory Visit: Payer: Medicare Other

## 2017-04-19 ENCOUNTER — Other Ambulatory Visit: Payer: Self-pay | Admitting: Internal Medicine

## 2017-04-19 DIAGNOSIS — E221 Hyperprolactinemia: Secondary | ICD-10-CM

## 2017-04-19 DIAGNOSIS — E038 Other specified hypothyroidism: Secondary | ICD-10-CM

## 2017-04-26 ENCOUNTER — Ambulatory Visit
Admission: RE | Admit: 2017-04-26 | Discharge: 2017-04-26 | Disposition: A | Discharge status: Home / Self Care | Payer: Medicare Other | Source: Ambulatory Visit | Attending: Internal Medicine | Admitting: Internal Medicine

## 2017-04-26 DIAGNOSIS — E038 Other specified hypothyroidism: Secondary | ICD-10-CM | POA: Insufficient documentation

## 2017-04-26 DIAGNOSIS — E221 Hyperprolactinemia: Secondary | ICD-10-CM | POA: Insufficient documentation

## 2017-04-26 DIAGNOSIS — H35363 Drusen (degenerative) of macula, bilateral: Secondary | ICD-10-CM | POA: Diagnosis not present

## 2017-04-26 LAB — POCT I-STAT CREATININE: Creatinine, Ser: 0.9 mg/dL (ref 0.61–1.24)

## 2017-04-26 MED ORDER — GADOBENATE DIMEGLUMINE 529 MG/ML IV SOLN
10.0000 mL | Freq: Once | INTRAVENOUS | Status: AC | PRN
  Administered 2017-04-26: 10 mL via INTRAVENOUS

## 2017-04-27 ENCOUNTER — Ambulatory Visit: Payer: Medicare Other

## 2017-05-03 ENCOUNTER — Ambulatory Visit (INDEPENDENT_AMBULATORY_CARE_PROVIDER_SITE_OTHER): Payer: Medicare Other

## 2017-05-03 VITALS — BP 145/88 | HR 84 | Temp 98.3°F | Ht 65.0 in | Wt 151.3 lb

## 2017-05-03 DIAGNOSIS — Z23 Encounter for immunization: Secondary | ICD-10-CM

## 2017-05-03 DIAGNOSIS — Z0000 Encounter for general adult medical examination without abnormal findings: Secondary | ICD-10-CM | POA: Diagnosis not present

## 2017-05-03 NOTE — Progress Notes (Signed)
Subjective:   Jerry Frey is a 66 y.o. male who presents for Medicare Annual/Subsequent preventive examination.  Review of Systems:  Cardiac Risk Factors include: male gender;advanced age (>55men, >16 women);hypertension     Objective:    Vitals: BP (!) 145/88 (BP Location: Left Arm)   Pulse 84   Temp 98.3 F (36.8 C) (Oral)   Ht 5\' 5"  (1.651 m)   Wt 151 lb 4.8 oz (68.6 kg)   BMI 25.18 kg/m   Body mass index is 25.18 kg/m.  Tobacco History  Smoking Status  . Never Smoker  Smokeless Tobacco  . Never Used     Counseling given: Not Answered   Past Medical History:  Diagnosis Date  . Hypertension   . Thyroid disease    Past Surgical History:  Procedure Laterality Date  . L4 compression fracture     History reviewed. No pertinent family history. History  Sexual Activity  . Sexual activity: Not on file    Outpatient Encounter Prescriptions as of 05/03/2017  Medication Sig  . amLODipine (NORVASC) 5 MG tablet Take 1 tablet (5 mg total) by mouth daily.  . benazepril (LOTENSIN) 40 MG tablet Take 1 tablet (40 mg total) by mouth daily.  Marland Kitchen levothyroxine (SYNTHROID, LEVOTHROID) 50 MCG tablet Take by mouth.   No facility-administered encounter medications on file as of 05/03/2017.     Activities of Daily Living In your present state of health, do you have any difficulty performing the following activities: 05/03/2017  Hearing? N  Vision? N  Difficulty concentrating or making decisions? N  Walking or climbing stairs? N  Dressing or bathing? N  Doing errands, shopping? N  Preparing Food and eating ? N  Using the Toilet? N  In the past six months, have you accidently leaked urine? N  Do you have problems with loss of bowel control? N  Managing your Medications? N  Managing your Finances? N  Housekeeping or managing your Housekeeping? N  Some recent data might be hidden    Patient Care Team: Guadalupe Maple, MD as PCP - General (Family Medicine) Hiram Gash, MD as Referring Physician (Orthopedic Surgery) Jill Side, MD (Gastroenterology) Gabriel Carina Betsey Holiday, MD as Physician Assistant (Endocrinology)   Assessment:     Exercise Activities and Dietary recommendations Current Exercise Habits: The patient does not participate in regular exercise at present;The patient has a physically strenous job, but has no regular exercise apart from work., Exercise limited by: None identified  Goals    None     Fall Risk Fall Risk  05/03/2017 05/02/2016 04/30/2015  Falls in the past year? No No No   Depression Screen PHQ 2/9 Scores 05/03/2017 05/02/2016 04/30/2015  PHQ - 2 Score 0 0 0    Cognitive Function     6CIT Screen 05/03/2017  What Year? 0 points  What month? 0 points  What time? 0 points  Count back from 20 0 points  Months in reverse 0 points  Repeat phrase 8 points  Total Score 8    Immunization History  Administered Date(s) Administered  . Influenza, High Dose Seasonal PF 05/02/2016  . Influenza-Unspecified 06/30/2014, 05/20/2015  . Pneumococcal Conjugate-13 05/02/2016  . Pneumococcal Polysaccharide-23 05/03/2017  . Td 09/28/2014   Screening Tests Health Maintenance  Topic Date Due  . INFLUENZA VACCINE  03/01/2017  . PNA vac Low Risk Adult (2 of 2 - PPSV23) 05/02/2017  . COLONOSCOPY  05/05/2017 (Originally 09/07/2014)  . TETANUS/TDAP  09/28/2024  .  Hepatitis C Screening  Completed      Plan:    I have personally reviewed and addressed the Medicare Annual Wellness questionnaire and have noted the following in the patient's chart:  A. Medical and social history B. Use of alcohol, tobacco or illicit drugs  C. Current medications and supplements D. Functional ability and status E.  Nutritional status F.  Physical activity G. Advance directives H. List of other physicians I.  Hospitalizations, surgeries, and ER visits in previous 12 months J.  [place] such as hearing and vision if needed,  cognitive and depression L. Referrals and appointments   In addition, I have reviewed and discussed with patient certain preventive protocols, quality metrics, and best practice recommendations. A written personalized care plan for preventive services as well as general preventive health recommendations were provided to patient.   Signed,  Tyler Aas, LPN Nurse Health Advisor   MD Recommendations: none

## 2017-05-03 NOTE — Patient Instructions (Signed)
Jerry Frey , Thank you for taking time to come for your Medicare Wellness Visit. I appreciate your ongoing commitment to your health goals. Please review the following plan we discussed and let me know if I can assist you in the future.   Screening recommendations/referrals: Colonoscopy: completed 09/16/2009 Recommended yearly ophthalmology/optometry visit for glaucoma screening and checkup Recommended yearly dental visit for hygiene and checkup  Vaccinations: Influenza vaccine: requested on 05/09/2017 at [facility] Pneumococcal vaccine: completed series Tdap vaccine: up to date Shingles vaccine: due, check with your insurance company for coverage  Advanced directives: Advance directive discussed with you today. Even though you declined this today please call our office should you change your mind and we can give you the proper paperwork for you to fill out.  Conditions/risks identified: none  Next appointment: Follow up on 05/09/2017 at 3:00pm with Dr.Crissman. Follow up in one year for your annual wellness exam.   Preventive Care 65 Years and Older, Male Preventive care refers to lifestyle choices and visits with your health care provider that can promote health and wellness. What does preventive care include?  A yearly physical exam. This is also called an annual well check.  Dental exams once or twice a year.  Routine eye exams. Ask your health care provider how often you should have your eyes checked.  Personal lifestyle choices, including:  Daily care of your teeth and gums.  Regular physical activity.  Eating a healthy diet.  Avoiding tobacco and drug use.  Limiting alcohol use.  Practicing safe sex.  Taking low doses of aspirin every day.  Taking vitamin and mineral supplements as recommended by your health care provider. What happens during an annual well check? The services and screenings done by your health care provider during your annual well check will depend on  your age, overall health, lifestyle risk factors, and family history of disease. Counseling  Your health care provider may ask you questions about your:  Alcohol use.  Tobacco use.  Drug use.  Emotional well-being.  Home and relationship well-being.  Sexual activity.  Eating habits.  History of falls.  Memory and ability to understand (cognition).  Work and work Statistician. Screening  You may have the following tests or measurements:  Height, weight, and BMI.  Blood pressure.  Lipid and cholesterol levels. These may be checked every 5 years, or more frequently if you are over 61 years old.  Skin check.  Lung cancer screening. You may have this screening every year starting at age 62 if you have a 30-pack-year history of smoking and currently smoke or have quit within the past 15 years.  Fecal occult blood test (FOBT) of the stool. You may have this test every year starting at age 76.  Flexible sigmoidoscopy or colonoscopy. You may have a sigmoidoscopy every 5 years or a colonoscopy every 10 years starting at age 77.  Prostate cancer screening. Recommendations will vary depending on your family history and other risks.  Hepatitis C blood test.  Hepatitis B blood test.  Sexually transmitted disease (STD) testing.  Diabetes screening. This is done by checking your blood sugar (glucose) after you have not eaten for a while (fasting). You may have this done every 1-3 years.  Abdominal aortic aneurysm (AAA) screening. You may need this if you are a current or former smoker.  Osteoporosis. You may be screened starting at age 78 if you are at high risk. Talk with your health care provider about your test results, treatment options,  and if necessary, the need for more tests. Vaccines  Your health care provider may recommend certain vaccines, such as:  Influenza vaccine. This is recommended every year.  Tetanus, diphtheria, and acellular pertussis (Tdap, Td) vaccine.  You may need a Td booster every 10 years.  Zoster vaccine. You may need this after age 10.  Pneumococcal 13-valent conjugate (PCV13) vaccine. One dose is recommended after age 4.  Pneumococcal polysaccharide (PPSV23) vaccine. One dose is recommended after age 58. Talk to your health care provider about which screenings and vaccines you need and how often you need them. This information is not intended to replace advice given to you by your health care provider. Make sure you discuss any questions you have with your health care provider. Document Released: 08/14/2015 Document Revised: 04/06/2016 Document Reviewed: 05/19/2015 Elsevier Interactive Patient Education  2017 [place] Prevention in the Home Falls can cause injuries. They can happen to people of all ages. There are many things you can do to make your home safe and to help prevent falls. What can I do on the outside of my home?  Regularly fix the edges of walkways and driveways and fix any cracks.  Remove anything that might make you trip as you walk through a door, such as a raised step or threshold.  Trim any bushes or trees on the path to your home.  Use bright outdoor lighting.  Clear any walking paths of anything that might make someone trip, such as rocks or tools.  Regularly check to see if handrails are loose or broken. Make sure that both sides of any steps have handrails.  Any raised decks and porches should have guardrails on the edges.  Have any leaves, snow, or ice cleared regularly.  Use sand or salt on walking paths during winter.  Clean up any spills in your garage right away. This includes oil or grease spills. What can I do in the bathroom?  Use night lights.  Install grab bars by the toilet and in the tub and shower. Do not use towel bars as grab bars.  Use non-skid mats or decals in the tub or shower.  If you need to sit down in the shower, use a plastic, non-slip stool.  Keep the  floor dry. Clean up any water that spills on the floor as soon as it happens.  Remove soap buildup in the tub or shower regularly.  Attach bath mats securely with double-sided non-slip rug tape.  Do not have throw rugs and other things on the floor that can make you trip. What can I do in the bedroom?  Use night lights.  Make sure that you have a light by your bed that is easy to reach.  Do not use any sheets or blankets that are too big for your bed. They should not hang down onto the floor.  Have a firm chair that has side arms. You can use this for support while you get dressed.  Do not have throw rugs and other things on the floor that can make you trip. What can I do in the kitchen?  Clean up any spills right away.  Avoid walking on wet floors.  Keep items that you use a lot in easy-to-reach places.  If you need to reach something above you, use a strong step stool that has a grab bar.  Keep electrical cords out of the way.  Do not use floor polish or wax that makes floors slippery. If  you must use wax, use non-skid floor wax.  Do not have throw rugs and other things on the floor that can make you trip. What can I do with my stairs?  Do not leave any items on the stairs.  Make sure that there are handrails on both sides of the stairs and use them. Fix handrails that are broken or loose. Make sure that handrails are as long as the stairways.  Check any carpeting to make sure that it is firmly attached to the stairs. Fix any carpet that is loose or worn.  Avoid having throw rugs at the top or bottom of the stairs. If you do have throw rugs, attach them to the floor with carpet tape.  Make sure that you have a light switch at the top of the stairs and the bottom of the stairs. If you do not have them, ask someone to add them for you. What else can I do to help prevent falls?  Wear shoes that:  Do not have high heels.  Have rubber bottoms.  Are comfortable and fit  you well.  Are closed at the toe. Do not wear sandals.  If you use a stepladder:  Make sure that it is fully opened. Do not climb a closed stepladder.  Make sure that both sides of the stepladder are locked into place.  Ask someone to hold it for you, if possible.  Clearly mark and make sure that you can see:  Any grab bars or handrails.  First and last steps.  Where the edge of each step is.  Use tools that help you move around (mobility aids) if they are needed. These include:  Canes.  Walkers.  Scooters.  Crutches.  Turn on the lights when you go into a dark area. Replace any light bulbs as soon as they burn out.  Set up your furniture so you have a clear path. Avoid moving your furniture around.  If any of your floors are uneven, fix them.  If there are any pets around you, be aware of where they are.  Review your medicines with your doctor. Some medicines can make you feel dizzy. This can increase your chance of falling. Ask your doctor what other things that you can do to help prevent falls. This information is not intended to replace advice given to you by your health care provider. Make sure you discuss any questions you have with your health care provider. Document Released: 05/14/2009 Document Revised: 12/24/2015 Document Reviewed: 08/22/2014 Elsevier Interactive Patient Education  2017 [employer].

## 2017-05-09 ENCOUNTER — Encounter: Payer: Self-pay | Admitting: Family Medicine

## 2017-05-09 ENCOUNTER — Ambulatory Visit (INDEPENDENT_AMBULATORY_CARE_PROVIDER_SITE_OTHER): Payer: Medicare Other | Admitting: Family Medicine

## 2017-05-09 VITALS — BP 130/86 | HR 91 | Ht 62.99 in | Wt 146.0 lb

## 2017-05-09 DIAGNOSIS — I10 Essential (primary) hypertension: Secondary | ICD-10-CM | POA: Diagnosis not present

## 2017-05-09 DIAGNOSIS — K648 Other hemorrhoids: Secondary | ICD-10-CM | POA: Diagnosis not present

## 2017-05-09 DIAGNOSIS — N400 Enlarged prostate without lower urinary tract symptoms: Secondary | ICD-10-CM | POA: Diagnosis not present

## 2017-05-09 DIAGNOSIS — R0989 Other specified symptoms and signs involving the circulatory and respiratory systems: Secondary | ICD-10-CM | POA: Diagnosis not present

## 2017-05-09 DIAGNOSIS — E0590 Thyrotoxicosis, unspecified without thyrotoxic crisis or storm: Secondary | ICD-10-CM | POA: Diagnosis not present

## 2017-05-09 MED ORDER — HYDROCORTISONE 2.5 % RE CREA: 1.0000 "application " | TOPICAL_CREAM | Freq: Two times a day (BID) | RECTAL | 0 refills | Status: DC

## 2017-05-09 MED ORDER — AMLODIPINE BESYLATE 5 MG PO TABS: 5.0000 mg | ORAL_TABLET | Freq: Every day | ORAL | 4 refills | Status: DC

## 2017-05-09 MED ORDER — BENAZEPRIL HCL 40 MG PO TABS: 40.0000 mg | ORAL_TABLET | Freq: Every day | ORAL | 4 refills | Status: DC

## 2017-05-09 NOTE — Assessment & Plan Note (Signed)
The current medical regimen is effective;  continue present plan and medications.  

## 2017-05-09 NOTE — Assessment & Plan Note (Signed)
Discussed hemorrhoid care and treatment will use hemorrhoidal cream and refer for colonoscopy

## 2017-05-09 NOTE — Progress Notes (Signed)
BP 130/86 (BP Location: Left Arm)   Pulse 91   Ht 5' 2.99" (1.6 m)   Wt 146 lb (66.2 kg)   SpO2 98%   BMI 25.87 kg/m    Subjective:    Patient ID: Jerry Frey, male    DOB: [birth year], 66 y.o.   MRN: [id number]  HPI: Jerry Frey is a 66 y.o. male  Chief Complaint  Patient presents with  . Annual Exam    Saw Nurse Health advisor  Patient with some concern over with bowel movement hemorrhoid like rejection comes from his anus area then goes back into his anus after bowel movement. No bleeding. Patient with no issues with blood pressure taking faithfully without problems. Same for thyroid taking thyroid medication appropriately and without fail. No complaints from thyroid. Reviewed MRI and MRI was 4 pituitary gland as ordered by endocrinology. Relevant past medical, surgical, family and social history reviewed and updated as indicated. Interim medical history since our last visit reviewed. Allergies and medications reviewed and updated.  Review of Systems  Constitutional: Negative.   HENT: Negative.   Eyes: Negative.   Respiratory: Negative.   Cardiovascular: Negative.   Gastrointestinal: Negative.   Endocrine: Negative.   Genitourinary: Negative.   Musculoskeletal: Negative.   Skin: Negative.   Allergic/Immunologic: Negative.   Neurological: Negative.   Hematological: Negative.   Psychiatric/Behavioral: Negative.     Per HPI unless specifically indicated above     Objective:    BP 130/86 (BP Location: Left Arm)   Pulse 91   Ht 5' 2.99" (1.6 m)   Wt 146 lb (66.2 kg)   SpO2 98%   BMI 25.87 kg/m   Wt Readings from Last 3 Encounters:  05/09/17 146 lb (66.2 kg)  05/03/17 151 lb 4.8 oz (68.6 kg)  10/31/16 150 lb (68 kg)    Physical Exam  Constitutional: He is oriented to person, place, and time. He appears well-developed and well-nourished.  HENT:  Head: Normocephalic and atraumatic.  Right Ear: External ear normal.  Left Ear: External ear  normal.  No bruit   Eyes: Pupils are equal, round, and reactive to light. Conjunctivae and EOM are normal.  Neck: Normal range of motion. Neck supple.  Cardiovascular: Normal rate, regular rhythm, normal heart sounds and intact distal pulses.   Pulmonary/Chest: Effort normal and breath sounds normal.  Abdominal: Soft. Bowel sounds are normal. There is no splenomegaly or hepatomegaly.  Genitourinary: Rectum normal, prostate normal and penis normal.  Musculoskeletal: Normal range of motion.  Neurological: He is alert and oriented to person, place, and time. He has normal reflexes.  Skin: No rash noted. No erythema.  Psychiatric: He has a normal mood and affect. His behavior is normal. Judgment and thought content normal.    Results for orders placed or performed during the hospital encounter of 04/26/17  I-STAT creatinine  Result Value Ref Range   Creatinine, Ser 0.90 0.61 - 1.24 mg/dL      Assessment & Plan:   Problem List Items Addressed This Visit      Cardiovascular and Mediastinum   Hypertension    The current medical regimen is effective;  continue present plan and medications.       Relevant Medications   amLODipine (NORVASC) 5 MG tablet   benazepril (LOTENSIN) 40 MG tablet   Other Relevant Orders   CBC with Differential/Platelet   Comprehensive metabolic panel   Lipid panel   Urinalysis, Routine w reflex microscopic   Hemorrhoid prolapse  Discussed hemorrhoid care and treatment will use hemorrhoidal cream and refer for colonoscopy      Relevant Medications   amLODipine (NORVASC) 5 MG tablet   benazepril (LOTENSIN) 40 MG tablet   Other Relevant Orders   Ambulatory referral to Gastroenterology     Endocrine   Hyperthyroidism - Primary    The current medical regimen is effective;  continue present plan and medications.       Relevant Orders   Lipid panel   Urinalysis, Routine w reflex microscopic     Genitourinary   BPH (benign prostatic hyperplasia)      The current medical regimen is effective;  continue present plan and medications.       Relevant Orders   PSA   Urinalysis, Routine w reflex microscopic     Other   Right carotid bruit    No carotid or TIA symptoms no bruit.          Follow up plan: Return in about 6 months (around 11/07/2017) for BMP.

## 2017-05-09 NOTE — Assessment & Plan Note (Addendum)
No carotid or TIA symptoms no bruit.

## 2017-05-10 ENCOUNTER — Encounter: Payer: Self-pay | Admitting: Family Medicine

## 2017-05-10 LAB — PSA: Prostate Specific Ag, Serum: 0.7 ng/mL (ref 0.0–4.0)

## 2017-05-10 LAB — LIPID PANEL
CHOL/HDL RATIO: 2.8 ratio (ref 0.0–5.0)
Cholesterol, Total: 171 mg/dL (ref 100–199)
HDL: 61 mg/dL (ref >39)
LDL CALC: 78 mg/dL (ref 0–99)
TRIGLYCERIDES: 158 mg/dL — AB (ref 0–149)
VLDL Cholesterol Cal: 32 mg/dL (ref 5–40)

## 2017-05-10 LAB — CBC WITH DIFFERENTIAL/PLATELET
Basophils Absolute: 0 x10E3/uL (ref 0.0–0.2)
Basos: 1 %
EOS (ABSOLUTE): 0.2 x10E3/uL (ref 0.0–0.4)
Eos: 3 %
Hematocrit: 45.9 % (ref 37.5–51.0)
Hemoglobin: 15.1 g/dL (ref 13.0–17.7)
Immature Grans (Abs): 0 x10E3/uL (ref 0.0–0.1)
Immature Granulocytes: 0 %
Lymphocytes Absolute: 1.7 x10E3/uL (ref 0.7–3.1)
Lymphs: 36 %
MCH: 30.5 pg (ref 26.6–33.0)
MCHC: 32.9 g/dL (ref 31.5–35.7)
MCV: 93 fL (ref 79–97)
Monocytes Absolute: 0.4 x10E3/uL (ref 0.1–0.9)
Monocytes: 8 %
Neutrophils Absolute: 2.5 x10E3/uL (ref 1.4–7.0)
Neutrophils: 52 %
Platelets: 165 x10E3/uL (ref 150–379)
RBC: 4.95 x10E6/uL (ref 4.14–5.80)
RDW: 14.3 % (ref 12.3–15.4)
WBC: 4.8 x10E3/uL (ref 3.4–10.8)

## 2017-05-10 LAB — URINALYSIS, ROUTINE W REFLEX MICROSCOPIC
BILIRUBIN UA: NEGATIVE
Glucose, UA: NEGATIVE
KETONES UA: NEGATIVE
Leukocytes, UA: NEGATIVE
Nitrite, UA: NEGATIVE
PROTEIN UA: NEGATIVE
SPEC GRAV UA: 1.01 (ref 1.005–1.030)
UUROB: 0.2 mg/dL (ref 0.2–1.0)
pH, UA: 5.5 (ref 5.0–7.5)

## 2017-05-10 LAB — COMPREHENSIVE METABOLIC PANEL WITH GFR
ALT: 50 IU/L — ABNORMAL HIGH (ref 0–44)
AST: 40 IU/L (ref 0–40)
Albumin/Globulin Ratio: 1.5 (ref 1.2–2.2)
Albumin: 4.4 g/dL (ref 3.6–4.8)
Alkaline Phosphatase: 48 IU/L (ref 39–117)
BUN/Creatinine Ratio: 20 (ref 10–24)
BUN: 18 mg/dL (ref 8–27)
Bilirubin Total: 0.5 mg/dL (ref 0.0–1.2)
CO2: 20 mmol/L (ref 20–29)
Calcium: 9.2 mg/dL (ref 8.6–10.2)
Chloride: 102 mmol/L (ref 96–106)
Creatinine, Ser: 0.9 mg/dL (ref 0.76–1.27)
GFR calc Af Amer: 102 mL/min/1.73
GFR calc non Af Amer: 88 mL/min/1.73
Globulin, Total: 2.9 g/dL (ref 1.5–4.5)
Glucose: 89 mg/dL (ref 65–99)
Potassium: 4 mmol/L (ref 3.5–5.2)
Sodium: 139 mmol/L (ref 134–144)
Total Protein: 7.3 g/dL (ref 6.0–8.5)

## 2017-06-08 ENCOUNTER — Other Ambulatory Visit
Admission: RE | Admit: 2017-06-08 | Discharge: 2017-06-08 | Disposition: A | Discharge status: Home / Self Care | Payer: Medicare Other | Source: Ambulatory Visit | Attending: Gastroenterology | Admitting: Gastroenterology

## 2017-06-08 ENCOUNTER — Encounter (INDEPENDENT_AMBULATORY_CARE_PROVIDER_SITE_OTHER): Payer: Self-pay

## 2017-06-08 ENCOUNTER — Encounter: Payer: Self-pay | Admitting: Gastroenterology

## 2017-06-08 ENCOUNTER — Ambulatory Visit (INDEPENDENT_AMBULATORY_CARE_PROVIDER_SITE_OTHER): Payer: Medicare Other | Admitting: Gastroenterology

## 2017-06-08 VITALS — BP 143/84 | HR 111 | Temp 98.5°F | Ht 62.0 in | Wt 150.4 lb

## 2017-06-08 DIAGNOSIS — K648 Other hemorrhoids: Secondary | ICD-10-CM

## 2017-06-08 DIAGNOSIS — R7401 Elevation of levels of liver transaminase levels: Secondary | ICD-10-CM

## 2017-06-08 DIAGNOSIS — K642 Third degree hemorrhoids: Secondary | ICD-10-CM | POA: Diagnosis not present

## 2017-06-08 DIAGNOSIS — Z8611 Personal history of tuberculosis: Secondary | ICD-10-CM | POA: Insufficient documentation

## 2017-06-08 DIAGNOSIS — R740 Nonspecific elevation of levels of transaminase and lactic acid dehydrogenase [LDH]: Secondary | ICD-10-CM

## 2017-06-08 LAB — HEPATIC FUNCTION PANEL
ALK PHOS: 47 U/L (ref 38–126)
ALT: 40 U/L (ref 17–63)
AST: 41 U/L (ref 15–41)
Albumin: 4.3 g/dL (ref 3.5–5.0)
BILIRUBIN TOTAL: 1.1 mg/dL (ref 0.3–1.2)
Total Protein: 7.6 g/dL (ref 6.5–8.1)

## 2017-06-08 NOTE — Progress Notes (Signed)
Cephas Darby, MD [facility]  [place], [place] [address]  Main: [phone]  Fax: [phone]    Gastroenterology Consultation  Referring Provider:     Guadalupe Maple, MD Primary Care Physician:  Guadalupe Maple, MD Primary Gastroenterologist:  Dr. Cephas Darby Reason for Consultation:     Hemorrhoid prolapse        HPI:   Jerry Frey is a 66 y.o. male from [place] referred by Dr. Guadalupe Maple, MD  for consultation & management of hemorrhoidal prolapse. He reports noticing soft tissue coming out every time he defecates and reduces spontaneously after defecation for the last 2-3 months. He otherwise denies bleeding, itching or pain, constipation, diarrhea. He denies weight loss or abdominal pain. He saw his primary care doctor who started him on hydrocortisone hemorrhoid cream which he thinks is helping. He still continues to feel the tissue coming out. He had a colonoscopy about 5 years ago at [facility] clinic and reportedly normal. However, I could not find the colonoscopy report  Of note, he has mildly elevated ALT from recent labs. He has hepatitis C antibody negative.  Denies any other GI symptoms.  NSAIDs: None  Antiplts/Anticoagulants/Anti thrombotics: None  History drinks about 4-6 beers daily Denies smoking Denies any GI surgeries Denies any family history of GI, liver conditions or malignancies GI Procedures: Colonoscopy about 5 years ago, reportedly normal per patient  Past Medical History:  Diagnosis Date  . Hypertension   . Thyroid disease     Past Surgical History:  Procedure Laterality Date  . L4 compression fracture      Prior to Admission medications   Medication Sig Start Date End Date Taking? Authorizing Provider  amLODipine (NORVASC) 5 MG tablet Take 1 tablet (5 mg total) by mouth daily. 05/09/17  Yes Crissman, Jeannette How, MD  benazepril (LOTENSIN) 40 MG tablet Take 1 tablet (40 mg total) by mouth daily. 05/09/17  Yes  Crissman, Jeannette How, MD  hydrocortisone (ANUSOL-HC) 2.5 % rectal cream Place 1 application rectally 2 (two) times daily. 05/09/17  Yes Guadalupe Maple, MD  levothyroxine (SYNTHROID, LEVOTHROID) 50 MCG tablet Take by mouth. 01/26/17 01/26/18 Yes [provider]    No family history on file.   Social History   Tobacco Use  . Smoking status: Never Smoker  . Smokeless tobacco: Never Used  Substance Use Topics  . Alcohol use: Yes    Alcohol/week: 25.2 oz    Types: 42 Cans of beer per week  . Drug use: No    Allergies as of 06/08/2017  . (No Known Allergies)    Review of Systems:    All systems reviewed and negative except where noted in HPI.   Physical Exam:  BP (!) 143/84 (BP Location: Right Arm, Patient Position: Sitting, Cuff Size: Normal)   Pulse (!) 111   Temp 98.5 F (36.9 C) (Oral)   Ht 5\' 2"  (1.575 m)   Wt 150 lb 6.4 oz (68.2 kg)   BMI 27.51 kg/m  No LMP for male patient.  General:   Alert,  Well-developed, well-nourished, pleasant and cooperative in NAD Head:  Normocephalic and atraumatic. Eyes:  Sclera clear, no icterus.   Conjunctiva pink. Ears:  Normal auditory acuity. Nose:  No deformity, discharge, or lesions. Mouth:  No deformity or lesions,oropharynx pink & moist. Neck:  Supple; no masses or thyromegaly. Lungs:  Respirations even and unlabored.  Clear throughout to auscultation.   No wheezes, crackles, or  rhonchi. No acute distress. Heart:  Regular rate and rhythm; no murmurs, clicks, rubs, or gallops. Abdomen:  Normal bowel sounds. Soft, non-tender and non-distended without masses, hepatosplenomegaly or hernias noted.  No guarding or rebound tenderness.   Rectal: No external hemorrhoids appreciated, normal perianal skin. Digital rectal exam revealed nontender, medium size internal hemorrhoid. When I asked him to be down, there was no prolapse of hemorrhoid  Msk:  Symmetrical without gross deformities. Good, equal movement & strength  bilaterally. Pulses:  Normal pulses noted. Extremities:  No clubbing or edema.  No cyanosis. Neurologic:  Alert and oriented x3;  grossly normal neurologically. Skin:  Intact without significant lesions or rashes. No jaundice. Lymph Nodes:  No significant cervical adenopathy. Psych:  Alert and cooperative. Normal mood and affect.  Imaging Studies none  Assessment and Plan:   Jerry Frey is a 66 y.o. male with 2-3 month history of grade 3 internal hemorrhoid without bleeding and other symptoms. He reports having had a colonoscopy done 5 years ago. He also has mildly elevated ALT  - Recommend to continue hydrocortisone hemorrhoidal cream for 2 months - Avoid constipation  Elevated ALT: - Recheck LFTs - Check hepatitis A and B serologies   Follow up in 2 months   Cephas Darby, MD

## 2017-06-09 LAB — HEPATITIS B SURFACE ANTIBODY,QUALITATIVE: Hep B S Ab: NONREACTIVE

## 2017-06-09 LAB — HEPATITIS B SURFACE AG, CONFIRM: HBSAG CONFIRMATION: POSITIVE — AB

## 2017-06-09 LAB — HEPATITIS B CORE ANTIBODY, TOTAL: HEP B C TOTAL AB: POSITIVE — AB

## 2017-06-09 LAB — HEPATITIS B SURFACE ANTIGEN

## 2017-06-12 ENCOUNTER — Other Ambulatory Visit: Payer: Self-pay | Admitting: Gastroenterology

## 2017-06-12 ENCOUNTER — Telehealth: Payer: Self-pay

## 2017-06-12 DIAGNOSIS — B181 Chronic viral hepatitis B without delta-agent: Secondary | ICD-10-CM

## 2017-06-12 NOTE — Telephone Encounter (Signed)
-----   Message from Lin Landsman, MD sent at 06/12/2017  3:41 PM EST ----- He has chronic hepatitis B infection.  Recommend checking hepatitis B viral load, E antigen status, AFP, US liver, [place] elastography.  Whetehr or not to treat this patient will depend on his results from these tests  Sharyn Lull,  Please notify the patient and ask him to go to the lab for further workup  Thanks RV

## 2017-06-12 NOTE — Telephone Encounter (Signed)
Pt notified of lab results as follows:  He has chronic hepatitis B infection. Recommend checking hepatitis B viral load, E antigen status, AFP.  Will call pt back with appts for U/S appt and Elastography.  Thanks [employer]

## 2017-06-13 ENCOUNTER — Other Ambulatory Visit: Payer: Self-pay

## 2017-06-13 DIAGNOSIS — B181 Chronic viral hepatitis B without delta-agent: Secondary | ICD-10-CM

## 2017-06-13 NOTE — Telephone Encounter (Signed)
Let's do only US abdomen for now   Rohini

## 2017-06-13 NOTE — Telephone Encounter (Signed)
[name] U/S of the liver has been scheduled for 06/20/17 at [facility] 9:15am.  He has been advised do not eat or drink anything 6 hours prior.    The U/S Elastography has not been ordered yet as it is not taking the diagnosis of Hep B.  Please advise on another diagnosis that I can associate to have it done.  Thanks [employer]

## 2017-06-15 ENCOUNTER — Other Ambulatory Visit
Admission: RE | Admit: 2017-06-15 | Discharge: 2017-06-15 | Disposition: A | Discharge status: Home / Self Care | Payer: Medicare Other | Source: Ambulatory Visit | Attending: Gastroenterology | Admitting: Gastroenterology

## 2017-06-15 DIAGNOSIS — B181 Chronic viral hepatitis B without delta-agent: Secondary | ICD-10-CM | POA: Insufficient documentation

## 2017-06-15 LAB — FERRITIN: FERRITIN: 357 ng/mL — AB (ref 24–336)

## 2017-06-16 LAB — HEPATITIS B E ANTIGEN: Hep B E Ag: NEGATIVE

## 2017-06-16 LAB — AFP TUMOR MARKER: AFP, SERUM, TUMOR MARKER: 4.1 ng/mL (ref 0.0–8.3)

## 2017-06-17 LAB — HEPATITIS B DNA, ULTRAQUANTITATIVE, PCR
HBV DNA SERPL PCR-ACNC: 30 [IU]/mL
HBV DNA SERPL PCR-LOG IU: 1.477 {Log_IU}/mL

## 2017-06-19 LAB — HEPATITIS B E ANTIBODY: HEP B E AB: POSITIVE — AB

## 2017-06-20 ENCOUNTER — Ambulatory Visit
Admission: RE | Admit: 2017-06-20 | Discharge: 2017-06-20 | Disposition: A | Discharge status: Home / Self Care | Payer: Medicare Other | Source: Ambulatory Visit | Attending: Gastroenterology | Admitting: Gastroenterology

## 2017-06-20 DIAGNOSIS — B181 Chronic viral hepatitis B without delta-agent: Secondary | ICD-10-CM | POA: Diagnosis not present

## 2017-06-29 ENCOUNTER — Telehealth: Payer: Self-pay

## 2017-06-29 NOTE — Telephone Encounter (Signed)
Pt  Has been informed based on his hep b serologies and viral load-he does not meet the criteria for Hep B treatment in seroconversion phase and we will monitor LFT's, viral load, Hep B S Ag every 93months, hep c AB with next set of labs.  Thanks [employer]

## 2017-06-29 NOTE — Telephone Encounter (Signed)
-----   Message from Lin Landsman, MD sent at 06/28/2017  1:23 PM EST ----- Based on his Hep B serologies and viral load, he does not meet the criteria for Hep B treatment, this is good news. He is in seroconversion phase and will monitor his LFTs, viral load and HepB S Ag every 65months. Recommend to check his hep C Ab with next set of labs  -RV

## 2017-06-29 NOTE — Telephone Encounter (Signed)
Pt has been informed us is normal.  Thanks Sharyn Lull

## 2017-06-29 NOTE — Telephone Encounter (Signed)
-----   Message from Lin Landsman, MD sent at 06/28/2017  1:24 PM EST ----- Normal [place] as well

## 2017-07-05 DIAGNOSIS — E221 Hyperprolactinemia: Secondary | ICD-10-CM | POA: Diagnosis not present

## 2017-07-05 DIAGNOSIS — E038 Other specified hypothyroidism: Secondary | ICD-10-CM | POA: Diagnosis not present

## 2017-08-14 ENCOUNTER — Encounter: Payer: Self-pay | Admitting: Gastroenterology

## 2017-08-14 ENCOUNTER — Other Ambulatory Visit: Payer: Self-pay

## 2017-08-14 ENCOUNTER — Ambulatory Visit: Payer: Medicare Other | Admitting: Gastroenterology

## 2017-08-29 ENCOUNTER — Encounter (INDEPENDENT_AMBULATORY_CARE_PROVIDER_SITE_OTHER): Payer: Self-pay

## 2017-08-29 ENCOUNTER — Encounter: Payer: Self-pay | Admitting: Gastroenterology

## 2017-08-29 ENCOUNTER — Ambulatory Visit (INDEPENDENT_AMBULATORY_CARE_PROVIDER_SITE_OTHER): Payer: Medicare Other | Admitting: Gastroenterology

## 2017-08-29 VITALS — BP 132/81 | HR 81 | Temp 98.3°F | Ht 64.0 in | Wt 154.4 lb

## 2017-08-29 DIAGNOSIS — K642 Third degree hemorrhoids: Secondary | ICD-10-CM

## 2017-08-29 DIAGNOSIS — B181 Chronic viral hepatitis B without delta-agent: Secondary | ICD-10-CM

## 2017-08-29 NOTE — Progress Notes (Signed)
Cephas Darby, MD [place]  [place], [place] [address]  Main: [phone]  Fax: [phone]    Gastroenterology Consultation  Referring Provider:     Guadalupe Maple, MD Primary Care Physician:  Guadalupe Maple, MD Primary Gastroenterologist:  Dr. Cephas Darby Reason for Consultation:     Hemorrhoid prolapse, chronic Hep B         HPI:   Jerry Frey is a 67 y.o. male from [place] referred by Dr. Guadalupe Maple, MD  for consultation & management of hemorrhoidal prolapse. He reports noticing soft tissue coming out every time he defecates and reduces spontaneously after defecation for the last 2-3 months. He otherwise denies bleeding, itching or pain, constipation, diarrhea. He denies weight loss or abdominal pain. He saw his primary care doctor who started him on hydrocortisone hemorrhoid cream which he thinks is helping. He still continues to feel the tissue coming out. He had a colonoscopy about 5 years ago at [facility] clinic and reportedly normal. However, I could not find the colonoscopy report  Of note, he has mildly elevated ALT from recent labs. He has hepatitis C antibody negative.  Denies any other GI symptoms.  Follow-up visit 08/29/2017: Since last visit, patient reports that the hemorrhoid prolapse has improved with topical hydrocortisone. He still see some tissue coming out but not as much as before. Also, he does have chronic hepatitis B infection with low viral load. He is probably in the phase of seroconversion. His hepatitis B E antibody positive, E antigen negative, surface antigen positive, core antibody positive. Ultrasound liver was normal.  NSAIDs: None  Antiplts/Anticoagulants/Anti thrombotics: None  History drinks about 4-6 beers daily Denies smoking Denies any GI surgeries Denies any family history of GI, liver conditions or malignancies GI Procedures: Colonoscopy about 5 years ago, reportedly normal per patient  Past  Medical History:  Diagnosis Date  . Hypertension   . Thyroid disease     Past Surgical History:  Procedure Laterality Date  . L4 compression fracture       Current Outpatient Medications:  .  amLODipine (NORVASC) 5 MG tablet, Take 1 tablet (5 mg total) by mouth daily., Disp: 90 tablet, Rfl: 4 .  benazepril (LOTENSIN) 40 MG tablet, Take 1 tablet (40 mg total) by mouth daily., Disp: 90 tablet, Rfl: 4 .  hydrocortisone (ANUSOL-HC) 2.5 % rectal cream, Place 1 application rectally 2 (two) times daily., Disp: 30 g, Rfl: 0 .  levothyroxine (SYNTHROID, LEVOTHROID) 75 MCG tablet, TAKE 1 TABLET ONCE DAILY TAKE ON AN EMPTY STOMACH WITH WATER AT LEAST 30-60 MINUTES BEFORE BREAKFAST, Disp: , Rfl: 1   History reviewed. No pertinent family history.   Social History   Tobacco Use  . Smoking status: Never Smoker  . Smokeless tobacco: Never Used  Substance Use Topics  . Alcohol use: Yes    Alcohol/week: 25.2 oz    Types: 42 Cans of beer per week  . Drug use: No    Allergies as of 08/29/2017  . (No Known Allergies)    Review of Systems:    All systems reviewed and negative except where noted in HPI.   Physical Exam:  BP 132/81   Pulse 81   Temp 98.3 F (36.8 C) (Oral)   Ht 5\' 4"  (1.626 m)   Wt 154 lb 6.4 oz (70 kg)   BMI 26.50 kg/m  No LMP for male patient.  General:   Alert,  Well-developed, well-nourished, pleasant and  cooperative in NAD Head:  Normocephalic and atraumatic. Eyes:  Sclera clear, no icterus.   Conjunctiva pink. Ears:  Normal auditory acuity. Nose:  No deformity, discharge, or lesions. Mouth:  No deformity or lesions,oropharynx pink & moist. Neck:  Supple; no masses or thyromegaly. Lungs:  Respirations even and unlabored.  Clear throughout to auscultation.   No wheezes, crackles, or rhonchi. No acute distress. Heart:  Regular rate and rhythm; no murmurs, clicks, rubs, or gallops. Abdomen:  Normal bowel sounds. Soft, non-tender and non-distended without masses,  hepatosplenomegaly or hernias noted.  No guarding or rebound tenderness.   Rectal: No external hemorrhoids appreciated, normal perianal skin.  Msk:  Symmetrical without gross deformities. Good, equal movement & strength bilaterally. Pulses:  Normal pulses noted. Extremities:  No clubbing or edema.  No cyanosis. Neurologic:  Alert and oriented x3;  grossly normal neurologically. Skin:  Intact without significant lesions or rashes. No jaundice. Lymph Nodes:  No significant cervical adenopathy. Psych:  Alert and cooperative. Normal mood and affect.  Imaging Studies none  Assessment and Plan:   Deward Sebek is a 67 y.o. Asian male with history of grade 3 internal hemorrhoid and chronic hepatitis B infection here for follow-up.  Grade 3 internal hemorrhoid: - asymptomatic with medical management - He reports having had a colonoscopy done 5 years ago.   Abnormal LFTs: Secondary to chronic hepatitis B infection, with a low viral load, surface antigen positive, E antigen negative, E antibody positive. He is probably in seroconversion  - Recheck viral load, surface antigen and LFTs in 6 months - ultrasound with no liver lesions, AFP normal  Follow up in 6 months   Cephas Darby, MD

## 2017-11-07 ENCOUNTER — Ambulatory Visit: Payer: Medicare Other | Admitting: Family Medicine

## 2017-11-28 ENCOUNTER — Encounter: Payer: Self-pay | Admitting: Family Medicine

## 2017-11-28 ENCOUNTER — Ambulatory Visit (INDEPENDENT_AMBULATORY_CARE_PROVIDER_SITE_OTHER): Payer: Medicare Other | Admitting: Family Medicine

## 2017-11-28 VITALS — BP 128/80 | HR 108 | Ht 64.0 in | Wt 149.0 lb

## 2017-11-28 DIAGNOSIS — I10 Essential (primary) hypertension: Secondary | ICD-10-CM

## 2017-11-28 DIAGNOSIS — N400 Enlarged prostate without lower urinary tract symptoms: Secondary | ICD-10-CM

## 2017-11-28 DIAGNOSIS — E038 Other specified hypothyroidism: Secondary | ICD-10-CM | POA: Diagnosis not present

## 2017-11-28 NOTE — Assessment & Plan Note (Signed)
Stable

## 2017-11-28 NOTE — Progress Notes (Signed)
   BP 128/80 (BP Location: Left Arm)   Pulse (!) 108   Ht 5\' 4"  (1.626 m)   Wt 149 lb (67.6 kg)   SpO2 98%   BMI 25.58 kg/m    Subjective:    Patient ID: Jerry Frey, male    DOB: [birth year], 67 y.o.   MRN: [id number]  HPI: Jerry Frey is a 68 y.o. male  Chief Complaint  Patient presents with  . Follow-up  . Hypertension  Blood pressure doing well no complaints taking medication faithfully without problems. Same for thyroid no issues. BPH symptoms are stable.  No complaints  Relevant past medical, surgical, family and social history reviewed and updated as indicated. Interim medical history since our last visit reviewed. Allergies and medications reviewed and updated.  Review of Systems  Constitutional: Negative.   Respiratory: Negative.   Cardiovascular: Negative.     Per HPI unless specifically indicated above     Objective:    BP 128/80 (BP Location: Left Arm)   Pulse (!) 108   Ht 5\' 4"  (1.626 m)   Wt 149 lb (67.6 kg)   SpO2 98%   BMI 25.58 kg/m   Wt Readings from Last 3 Encounters:  11/28/17 149 lb (67.6 kg)  08/29/17 154 lb 6.4 oz (70 kg)  06/08/17 150 lb 6.4 oz (68.2 kg)    Physical Exam  Constitutional: He is oriented to person, place, and time. He appears well-developed and well-nourished.  HENT:  Head: Normocephalic and atraumatic.  Eyes: Conjunctivae and EOM are normal.  Neck: Normal range of motion.  Cardiovascular: Normal rate, regular rhythm and normal heart sounds.  Pulmonary/Chest: Effort normal and breath sounds normal.  Musculoskeletal: Normal range of motion.  Neurological: He is alert and oriented to person, place, and time.  Skin: No erythema.  Psychiatric: He has a normal mood and affect. His behavior is normal. Judgment and thought content normal.    Results for orders placed or performed during the hospital encounter of 06/15/17  Hepatitis B DNA, ultraquantitative, PCR  Result Value Ref Range   HBV DNA SERPL PCR-ACNC  30 IU/mL   HBV DNA SERPL PCR-LOG IU 1.477 log10 IU/mL   Test Info: Comment   Hepatitis B E Antibody  Result Value Ref Range   Hep B E Ab Positive (A) Negative  Hepatitis B E Antigen  Result Value Ref Range   Hep B E Ag Negative Negative  Ferritin  Result Value Ref Range   Ferritin 357 (H) 24 - 336 ng/mL  AFP tumor marker  Result Value Ref Range   AFP, Serum, Tumor Marker 4.1 0.0 - 8.3 ng/mL      Assessment & Plan:   Problem List Items Addressed This Visit      Cardiovascular and Mediastinum   Hypertension - Primary    The current medical regimen is effective;  continue present plan and medications.       Relevant Orders   Basic metabolic panel     Endocrine   Central hypothyroidism    The current medical regimen is effective;  continue present plan and medications.         Genitourinary   BPH (benign prostatic hyperplasia)    Stable           Follow up plan: Return in about 6 months (around 05/30/2018) for Physical Exam.

## 2017-11-28 NOTE — Assessment & Plan Note (Signed)
The current medical regimen is effective;  continue present plan and medications.  

## 2017-11-29 LAB — BASIC METABOLIC PANEL
BUN / CREAT RATIO: 11 (ref 10–24)
BUN: 13 mg/dL (ref 8–27)
CALCIUM: 9.7 mg/dL (ref 8.6–10.2)
CO2: 25 mmol/L (ref 20–29)
Chloride: 103 mmol/L (ref 96–106)
Creatinine, Ser: 1.16 mg/dL (ref 0.76–1.27)
GFR calc non Af Amer: 64 mL/min/{1.73_m2} (ref >59)
GFR, EST AFRICAN AMERICAN: 74 mL/min/{1.73_m2} (ref >59)
Glucose: 228 mg/dL — ABNORMAL HIGH (ref 65–99)
Potassium: 4 mmol/L (ref 3.5–5.2)
Sodium: 144 mmol/L (ref 134–144)

## 2017-11-30 ENCOUNTER — Telehealth: Payer: Self-pay | Admitting: Family Medicine

## 2017-11-30 DIAGNOSIS — R739 Hyperglycemia, unspecified: Secondary | ICD-10-CM

## 2017-11-30 NOTE — Telephone Encounter (Signed)
-----   Message from Amada Kingfisher, [place] sent at 11/30/2017  3:55 PM EDT ----- Patient was transferred to provider for telephone conversation.

## 2017-11-30 NOTE — Telephone Encounter (Signed)
Phone call Discussed with patient elevated blood sugar on chart review patient had hemoglobin A1c previously which was borderline normal.  Will recheck hemoglobin A1c.

## 2018-02-06 DIAGNOSIS — E221 Hyperprolactinemia: Secondary | ICD-10-CM | POA: Diagnosis not present

## 2018-02-06 DIAGNOSIS — E038 Other specified hypothyroidism: Secondary | ICD-10-CM | POA: Diagnosis not present

## 2018-05-23 ENCOUNTER — Ambulatory Visit (INDEPENDENT_AMBULATORY_CARE_PROVIDER_SITE_OTHER): Payer: Medicare Other

## 2018-05-23 VITALS — BP 130/76 | HR 85 | Temp 98.3°F | Ht 64.5 in | Wt 149.0 lb

## 2018-05-23 DIAGNOSIS — Z23 Encounter for immunization: Secondary | ICD-10-CM | POA: Diagnosis not present

## 2018-05-23 DIAGNOSIS — Z0000 Encounter for general adult medical examination without abnormal findings: Secondary | ICD-10-CM | POA: Diagnosis not present

## 2018-05-23 NOTE — Progress Notes (Signed)
Subjective:   Jerry Frey is a 67 y.o. male who presents for Medicare Annual/Subsequent preventive examination.  Review of Systems:  N/A Cardiac Risk Factors include: advanced age (>47men, >81 women);hypertension;male gender;sedentary lifestyle     Objective:    Vitals: BP 130/76   Pulse 85   Temp 98.3 F (36.8 C)   Ht 5' 4.5" (1.638 m)   Wt 149 lb (67.6 kg)   BMI 25.18 kg/m   Body mass index is 25.18 kg/m.  Advanced Directives 05/23/2018 05/23/2018 05/03/2017  Does Patient Have a Medical Advance Directive? No No No  Type of Advance Directive - Public librarian;Living will  Would patient like information on creating a medical advance directive? Yes (MAU/Ambulatory/Procedural Areas - Information given) Yes (MAU/Ambulatory/Procedural Areas - Information given) -    Tobacco Social History   Tobacco Use  Smoking Status Never Smoker  Smokeless Tobacco Never Used     Counseling given: Not Answered   Clinical Intake:  Pre-visit preparation completed: Yes  Pain : No/denies pain     Nutritional Status: BMI 25 -29 Overweight Nutritional Risks: None Diabetes: No  How often do you need to have someone help you when you read instructions, pamphlets, or other written materials from your doctor or pharmacy?: 1 - Never What is the last grade level you completed in school?: 12th grade  Interpreter Needed?: No  Information entered by :: Clemetine Marker LPN  Past Medical History:  Diagnosis Date  . Hypertension   . Thyroid disease    Past Surgical History:  Procedure Laterality Date  . L4 compression fracture     Family History  Family history unknown: Yes   Social History   Socioeconomic History  . Marital status: Married    Spouse name: Not on file  . Number of children: Not on file  . Years of education: Not on file  . Highest education level: 12th grade  Occupational History  . Occupation: retired  Scientific laboratory technician  . Financial resource  strain: Somewhat hard  . Food insecurity:    Worry: Never true    Inability: Never true  . Transportation needs:    Medical: No    Non-medical: No  Tobacco Use  . Smoking status: Never Smoker  . Smokeless tobacco: Never Used  Substance and Sexual Activity  . Alcohol use: Yes    Alcohol/week: 24.0 standard drinks    Types: 24 Cans of beer per week    Comment: per week  . Drug use: No  . Sexual activity: Not on file  Lifestyle  . Physical activity:    Days per week: 0 days    Minutes per session: 0 min  . Stress: Patient refused  Relationships  . Social connections:    Talks on phone: Once a week    Gets together: Once a week    Attends religious service: Never    Active member of club or organization: No    Attends meetings of clubs or organizations: Never    Relationship status: Married  Other Topics Concern  . Not on file  Social History Narrative  . Not on file    Outpatient Encounter Medications as of 05/23/2018  Medication Sig  . amLODipine (NORVASC) 5 MG tablet Take 1 tablet (5 mg total) by mouth daily.  . benazepril (LOTENSIN) 40 MG tablet Take 1 tablet (40 mg total) by mouth daily.  Marland Kitchen levothyroxine (SYNTHROID, LEVOTHROID) 100 MCG tablet   . levothyroxine (SYNTHROID, LEVOTHROID) 75 MCG  tablet TAKE 1 TABLET ONCE DAILY TAKE ON AN EMPTY STOMACH WITH WATER AT LEAST 30-60 MINUTES BEFORE BREAKFAST   No facility-administered encounter medications on file as of 05/23/2018.     Activities of Daily Living In your present state of health, do you have any difficulty performing the following activities: 05/23/2018 11/28/2017  Hearing? N N  Comment declines hearing aids -  Vision? N N  Comment wears glasses; goes to [facility] -  Difficulty concentrating or making decisions? N N  Walking or climbing stairs? N N  Dressing or bathing? N N  Doing errands, shopping? N N  Preparing Food and eating ? N -  Using the Toilet? N -  In the past six months, have you  accidently leaked urine? N -  Do you have problems with loss of bowel control? N -  Managing your Medications? N -  Managing your Finances? N -  Housekeeping or managing your Housekeeping? N -  Some recent data might be hidden    Patient Care Team: Guadalupe Maple, MD as PCP - General (Family Medicine) Hiram Gash, MD as Referring Physician (Orthopedic Surgery) Judi Cong, MD as Physician Assistant (Endocrinology) Lin Landsman, MD as Consulting Physician (Gastroenterology)   Assessment:   This is a routine wellness examination for Hannah.  Exercise Activities and Dietary recommendations Current Exercise Habits: The patient does not participate in regular exercise at present, Exercise limited by: None identified  Goals    . DIET - INCREASE WATER INTAKE     Increase water intake to 4 bottles per day       Fall Risk Fall Risk  05/23/2018 11/28/2017 05/03/2017 05/02/2016 04/30/2015  Falls in the past year? No No No No No   FALL RISK PREVENTION PERTAINING TO THE HOME:  Any stairs in or around the home WITH handrails? No  Home free of loose throw rugs in walkways, pet beds, electrical cords, etc? Yes Adequate lighting in your home to reduce risk of falls? Yes   ASSISTIVE DEVICES UTILIZED TO PREVENT FALLS:  Life alert? No  Use of a cane, walker or w/c? No  Grab bars in the bathroom? No  Shower chair or bench in shower? No  Elevated toilet seat or a handicapped toilet? No   DME ORDERS:  DME order needed?  No   TIMED UP AND GO:  Was the test performed? Yes .  Length of time to ambulate 10 feet: 6 sec.   GAIT:  Appearance of gait: Gait stead-fast Education: Fall risk prevention has been discussed.  Intervention(s) required? No    Depression Screen PHQ 2/9 Scores 05/23/2018 11/28/2017 05/03/2017 05/02/2016  PHQ - 2 Score 0 0 0 0    Cognitive Function     6CIT Screen 05/23/2018 05/03/2017  What Year? 0 points 0 points  What month? 0 points 0  points  What time? 0 points 0 points  Count back from 20 0 points 0 points  Months in reverse 0 points 0 points  Repeat phrase 6 points 8 points  Total Score 6 8    Immunization History  Administered Date(s) Administered  . Influenza, High Dose Seasonal PF 05/02/2016, 05/23/2018  . Influenza-Unspecified 06/30/2014, 05/20/2015  . Pneumococcal Conjugate-13 05/02/2016  . Pneumococcal Polysaccharide-23 05/03/2017  . Td 09/28/2014    Qualifies for Shingles Vaccine? Yes  Due for Shingrix. Education has been provided regarding the importance of this vaccine. Pt has been advised to call insurance company to determine out  of pocket expense. Advised may also receive vaccine at local pharmacy or Health Dept. Verbalized acceptance and understanding.  Tdap: up to date 09/28/14  Flu Vaccine: Due for Flu vaccine. Does the patient want to receive this vaccine today?  Yes .   Pneumococcal Vaccine: completed series  Screening Tests Health Maintenance  Topic Date Due  . COLONOSCOPY  09/07/2014  . INFLUENZA VACCINE  03/01/2018  . TETANUS/TDAP  09/28/2024  . Hepatitis C Screening  Completed  . PNA vac Low Risk Adult  Completed   Cancer Screenings:  Colorectal Screening: declined  Lung Cancer Screening: (Low Dose CT Chest recommended if Age 58-80 years, 30 pack-year currently smoking OR have quit w/in 15years.) does not qualify.   Additional Screening:  Hepatitis C Screening: does qualify; Completed 05/02/16  Vision Screening: Recommended annual ophthalmology exams for early detection of glaucoma and other disorders of the eye. Is the patient up to date with their annual eye exam?  Yes  Who is the provider or what is the name of the office in which the pt attends annual eye exams? [facility]  Dental Screening: Recommended annual dental exams for proper oral hygiene  Community Resource Referral:  CRR required this visit?  No  Provided information on [facility] $4 generic lists.  Patient to discuss at next appt.  .    Plan:  I have personally reviewed and addressed the Medicare Annual Wellness questionnaire and have noted the following in the patient's chart:  A. Medical and social history B. Use of alcohol, tobacco or illicit drugs  C. Current medications and supplements D. Functional ability and status E.  Nutritional status F.  Physical activity G. Advance directives H. List of other physicians I.  Hospitalizations, surgeries, and ER visits in previous 12 months J.  Okay such as hearing and vision if needed, cognitive and depression L. Referrals and appointments   In addition, I have reviewed and discussed with patient certain preventive protocols, quality metrics, and best practice recommendations. A written personalized care plan for preventive services as well as general preventive health recommendations were provided to patient.   Signed,  Clemetine Marker, LPN Nurse Health Advisor   Nurse Notes: Patient stated medications were expensive, discussed [facility] $4 list. Pharmacy updated and needs prescriptions resent.

## 2018-05-23 NOTE — Patient Instructions (Signed)
Jerry Frey , Thank you for taking time to come for your Medicare Wellness Visit. I appreciate your ongoing commitment to your health goals. Please review the following plan we discussed and let me know if I can assist you in the future.   Screening recommendations/referrals: Colonoscopy: due now - declined Recommended yearly ophthalmology/optometry visit for glaucoma screening and checkup Recommended yearly dental visit for hygiene and checkup  Vaccinations: Influenza vaccine: done today Pneumococcal vaccine: completed series Tdap vaccine: up to date Shingles vaccine: Shingarix eligible check with your insurance company for coverage  Advanced directives: Advance directive discussed with you today. I have provided a copy for you to complete at home and have notarized. Once this is complete please bring a copy in to our office so we can scan it into your chart.  Conditions/risks identified: Recommend increase water intake to 4 bottles per day.  Next appointment: Dr. Jeananne Rama 05/30/18 3:00 pm  Follow up in one year for your Medicare Annual Wellness Visit  Preventive Care 69 Years and Older, Male Preventive care refers to lifestyle choices and visits with your health care provider that can promote health and wellness. What does preventive care include?  A yearly physical exam. This is also called an annual well check.  Dental exams once or twice a year.  Routine eye exams. Ask your health care provider how often you should have your eyes checked.  Personal lifestyle choices, including:  Daily care of your teeth and gums.  Regular physical activity.  Eating a healthy diet.  Avoiding tobacco and drug use.  Limiting alcohol use.  Practicing safe sex.  Taking low doses of aspirin every day.  Taking vitamin and mineral supplements as recommended by your health care provider. What happens during an annual well check? The services and screenings done by your health care  provider during your annual well check will depend on your age, overall health, lifestyle risk factors, and family history of disease. Counseling  Your health care provider may ask you questions about your:  Alcohol use.  Tobacco use.  Drug use.  Emotional well-being.  Home and relationship well-being.  Sexual activity.  Eating habits.  History of falls.  Memory and ability to understand (cognition).  Work and work Statistician. Screening  You may have the following tests or measurements:  Height, weight, and BMI.  Blood pressure.  Lipid and cholesterol levels. These may be checked every 5 years, or more frequently if you are over 69 years old.  Skin check.  Lung cancer screening. You may have this screening every year starting at age 69 if you have a 30-pack-year history of smoking and currently smoke or have quit within the past 15 years.  Fecal occult blood test (FOBT) of the stool. You may have this test every year starting at age 69.  Flexible sigmoidoscopy or colonoscopy. You may have a sigmoidoscopy every 5 years or a colonoscopy every 10 years starting at age 69.  Prostate cancer screening. Recommendations will vary depending on your family history and other risks.  Hepatitis C blood test.  Hepatitis B blood test.  Sexually transmitted disease (STD) testing.  Diabetes screening. This is done by checking your blood sugar (glucose) after you have not eaten for a while (fasting). You may have this done every 1-3 years.  Abdominal aortic aneurysm (AAA) screening. You may need this if you are a current or former smoker.  Osteoporosis. You may be screened starting at age 69 if you are at high risk.  Talk with your health care provider about your test results, treatment options, and if necessary, the need for more tests. Vaccines  Your health care provider may recommend certain vaccines, such as:  Influenza vaccine. This is recommended every year.  Tetanus,  diphtheria, and acellular pertussis (Tdap, Td) vaccine. You may need a Td booster every 10 years.  Zoster vaccine. You may need this after age 69.  Pneumococcal 13-valent conjugate (PCV13) vaccine. One dose is recommended after age 69.  Pneumococcal polysaccharide (PPSV23) vaccine. One dose is recommended after age 69. Talk to your health care provider about which screenings and vaccines you need and how often you need them. This information is not intended to replace advice given to you by your health care provider. Make sure you discuss any questions you have with your health care provider. Document Released: 08/14/2015 Document Revised: 04/06/2016 Document Reviewed: 05/19/2015 Elsevier Interactive Patient Education  2017 [place] Prevention in the Home Falls can cause injuries. They can happen to people of all ages. There are many things you can do to make your home safe and to help prevent falls. What can I do on the outside of my home?  Regularly fix the edges of walkways and driveways and fix any cracks.  Remove anything that might make you trip as you walk through a door, such as a raised step or threshold.  Trim any bushes or trees on the path to your home.  Use bright outdoor lighting.  Clear any walking paths of anything that might make someone trip, such as rocks or tools.  Regularly check to see if handrails are loose or broken. Make sure that both sides of any steps have handrails.  Any raised decks and porches should have guardrails on the edges.  Have any leaves, snow, or ice cleared regularly.  Use sand or salt on walking paths during winter.  Clean up any spills in your garage right away. This includes oil or grease spills. What can I do in the bathroom?  Use night lights.  Install grab bars by the toilet and in the tub and shower. Do not use towel bars as grab bars.  Use non-skid mats or decals in the tub or shower.  If you need to sit down in  the shower, use a plastic, non-slip stool.  Keep the floor dry. Clean up any water that spills on the floor as soon as it happens.  Remove soap buildup in the tub or shower regularly.  Attach bath mats securely with double-sided non-slip rug tape.  Do not have throw rugs and other things on the floor that can make you trip. What can I do in the bedroom?  Use night lights.  Make sure that you have a light by your bed that is easy to reach.  Do not use any sheets or blankets that are too big for your bed. They should not hang down onto the floor.  Have a firm chair that has side arms. You can use this for support while you get dressed.  Do not have throw rugs and other things on the floor that can make you trip. What can I do in the kitchen?  Clean up any spills right away.  Avoid walking on wet floors.  Keep items that you use a lot in easy-to-reach places.  If you need to reach something above you, use a strong step stool that has a grab bar.  Keep electrical cords out of the way.  Do not use floor polish or wax that makes floors slippery. If you must use wax, use non-skid floor wax.  Do not have throw rugs and other things on the floor that can make you trip. What can I do with my stairs?  Do not leave any items on the stairs.  Make sure that there are handrails on both sides of the stairs and use them. Fix handrails that are broken or loose. Make sure that handrails are as long as the stairways.  Check any carpeting to make sure that it is firmly attached to the stairs. Fix any carpet that is loose or worn.  Avoid having throw rugs at the top or bottom of the stairs. If you do have throw rugs, attach them to the floor with carpet tape.  Make sure that you have a light switch at the top of the stairs and the bottom of the stairs. If you do not have them, ask someone to add them for you. What else can I do to help prevent falls?  Wear shoes that:  Do not have high  heels.  Have rubber bottoms.  Are comfortable and fit you well.  Are closed at the toe. Do not wear sandals.  If you use a stepladder:  Make sure that it is fully opened. Do not climb a closed stepladder.  Make sure that both sides of the stepladder are locked into place.  Ask someone to hold it for you, if possible.  Clearly mark and make sure that you can see:  Any grab bars or handrails.  First and last steps.  Where the edge of each step is.  Use tools that help you move around (mobility aids) if they are needed. These include:  Canes.  Walkers.  Scooters.  Crutches.  Turn on the lights when you go into a dark area. Replace any light bulbs as soon as they burn out.  Set up your furniture so you have a clear path. Avoid moving your furniture around.  If any of your floors are uneven, fix them.  If there are any pets around you, be aware of where they are.  Review your medicines with your doctor. Some medicines can make you feel dizzy. This can increase your chance of falling. Ask your doctor what other things that you can do to help prevent falls. This information is not intended to replace advice given to you by your health care provider. Make sure you discuss any questions you have with your health care provider. Document Released: 05/14/2009 Document Revised: 12/24/2015 Document Reviewed: 08/22/2014 Elsevier Interactive Patient Education  2017 [place].   Influenza (Flu) Vaccine (Inactivated or Recombinant): What You Need to Know 1. Why get vaccinated? Influenza ("flu") is a contagious disease that spreads around the [place] every year, usually between October and May. Flu is caused by influenza viruses, and is spread mainly by coughing, sneezing, and close contact. Anyone can get flu. Flu strikes suddenly and can last several days. Symptoms vary by age, but can include:  fever/chills  sore throat  muscle  aches  fatigue  cough  headache  runny or stuffy nose  Flu can also lead to pneumonia and blood infections, and cause diarrhea and seizures in children. If you have a medical condition, such as heart or lung disease, flu can make it worse. Flu is more dangerous for some people. Infants and young children, people 37 years of age and older, pregnant women, and people with certain health  conditions or a weakened immune system are at greatest risk. Each year thousands of people in the [place] die from flu, and many more are hospitalized. Flu vaccine can:  keep you from getting flu,  make flu less severe if you do get it, and  keep you from spreading flu to your family and other people. 2. Inactivated and recombinant flu vaccines A dose of flu vaccine is recommended every flu season. Children 6 months through 26 years of age may need two doses during the same flu season. Everyone else needs only one dose each flu season. Some inactivated flu vaccines contain a very small amount of a mercury-based preservative called thimerosal. Studies have not shown thimerosal in vaccines to be harmful, but flu vaccines that do not contain thimerosal are available. There is no live flu virus in flu shots. They cannot cause the flu. There are many flu viruses, and they are always changing. Each year a new flu vaccine is made to protect against three or four viruses that are likely to cause disease in the upcoming flu season. But even when the vaccine doesn't exactly match these viruses, it may still provide some protection. Flu vaccine cannot prevent:  flu that is caused by a virus not covered by the vaccine, or  illnesses that look like flu but are not.  It takes about 2 weeks for protection to develop after vaccination, and protection lasts through the flu season. 3. Some people should not get this vaccine Tell the person who is giving you the vaccine:  If you have any severe, life-threatening  allergies. If you ever had a life-threatening allergic reaction after a dose of flu vaccine, or have a severe allergy to any part of this vaccine, you may be advised not to get vaccinated. Most, but not all, types of flu vaccine contain a small amount of egg protein.  If you ever had Guillain-Barr Syndrome (also called GBS). Some people with a history of GBS should not get this vaccine. This should be discussed with your doctor.  If you are not feeling well. It is usually okay to get flu vaccine when you have a mild illness, but you might be asked to come back when you feel better.  4. Risks of a vaccine reaction With any medicine, including vaccines, there is a chance of reactions. These are usually mild and go away on their own, but serious reactions are also possible. Most people who get a flu shot do not have any problems with it. Minor problems following a flu shot include:  soreness, redness, or swelling where the shot was given  hoarseness  sore, red or itchy eyes  cough  fever  aches  headache  itching  fatigue  If these problems occur, they usually begin soon after the shot and last 1 or 2 days. More serious problems following a flu shot can include the following:  There may be a small increased risk of Guillain-Barre Syndrome (GBS) after inactivated flu vaccine. This risk has been estimated at 1 or 2 additional cases per million people vaccinated. This is much lower than the risk of severe complications from flu, which can be prevented by flu vaccine.  Young children who get the flu shot along with pneumococcal vaccine (PCV13) and/or DTaP vaccine at the same time might be slightly more likely to have a seizure caused by fever. Ask your doctor for more information. Tell your doctor if a child who is getting flu vaccine has ever had  a seizure.  Problems that could happen after any injected vaccine:  People sometimes faint after a medical procedure, including  vaccination. Sitting or lying down for about 15 minutes can help prevent fainting, and injuries caused by a fall. Tell your doctor if you feel dizzy, or have vision changes or ringing in the ears.  Some people get severe pain in the shoulder and have difficulty moving the arm where a shot was given. This happens very rarely.  Any medication can cause a severe allergic reaction. Such reactions from a vaccine are very rare, estimated at about 1 in a million doses, and would happen within a few minutes to a few hours after the vaccination. As with any medicine, there is a very remote chance of a vaccine causing a serious injury or death. The safety of vaccines is always being monitored. For more information, visit: http://www.aguilar.org/ 5. What if there is a serious reaction? What should I look for? Look for anything that concerns you, such as signs of a severe allergic reaction, very high fever, or unusual behavior. Signs of a severe allergic reaction can include hives, swelling of the face and throat, difficulty breathing, a fast heartbeat, dizziness, and weakness. These would start a few minutes to a few hours after the vaccination. What should I do?  If you think it is a severe allergic reaction or other emergency that can't wait, call 9-1-1 and get the person to the nearest hospital. Otherwise, call your doctor.  Reactions should be reported to the Vaccine Adverse Event Reporting System (VAERS). Your doctor should file this report, or you can do it yourself through the VAERS web site at www.vaers.SamedayNews.es, or by calling (901) 551-4654. ? VAERS does not give medical advice. 6. The National Vaccine Injury Compensation Program The [employer] Vaccine Injury Compensation Program (VICP) is a federal program that was created to compensate people who may have been injured by certain vaccines. Persons who believe they may have been injured by a vaccine can learn about the program and about filing a  claim by calling 3308225416 or visiting the [place] website at GoldCloset.com.ee. There is a time limit to file a claim for compensation. 7. How can I learn more?  Ask your healthcare provider. He or she can give you the vaccine package insert or suggest other sources of information.  Call your local or state health department.  Contact the Centers for Disease Control and Prevention (CDC): ? Call 667-019-6743 (1-800-CDC-INFO) or ? Visit CDC's website at https://gibson.com/ Vaccine Information Statement, Inactivated Influenza Vaccine (03/07/2014) This information is not intended to replace advice given to you by your health care provider. Make sure you discuss any questions you have with your health care provider. Document Released: 05/12/2006 Document Revised: 04/07/2016 Document Reviewed: 04/07/2016 Elsevier Interactive Patient Education  2017 [employer].

## 2018-05-30 ENCOUNTER — Ambulatory Visit (INDEPENDENT_AMBULATORY_CARE_PROVIDER_SITE_OTHER): Payer: Medicare Other | Admitting: Family Medicine

## 2018-05-30 ENCOUNTER — Encounter: Payer: Self-pay | Admitting: Family Medicine

## 2018-05-30 ENCOUNTER — Telehealth: Payer: Self-pay | Admitting: Family Medicine

## 2018-05-30 VITALS — BP 115/80 | HR 102 | Temp 98.7°F | Ht 64.5 in | Wt 146.1 lb

## 2018-05-30 DIAGNOSIS — E038 Other specified hypothyroidism: Secondary | ICD-10-CM | POA: Diagnosis not present

## 2018-05-30 DIAGNOSIS — R7309 Other abnormal glucose: Secondary | ICD-10-CM | POA: Diagnosis not present

## 2018-05-30 DIAGNOSIS — I10 Essential (primary) hypertension: Secondary | ICD-10-CM

## 2018-05-30 DIAGNOSIS — N400 Enlarged prostate without lower urinary tract symptoms: Secondary | ICD-10-CM

## 2018-05-30 LAB — URINALYSIS, ROUTINE W REFLEX MICROSCOPIC
BILIRUBIN UA: NEGATIVE
GLUCOSE, UA: NEGATIVE
KETONES UA: NEGATIVE
LEUKOCYTES UA: NEGATIVE
Nitrite, UA: NEGATIVE
PROTEIN UA: NEGATIVE
Specific Gravity, UA: 1.015 (ref 1.005–1.030)
UUROB: 0.2 mg/dL (ref 0.2–1.0)
pH, UA: 5.5 (ref 5.0–7.5)

## 2018-05-30 LAB — BAYER DCA HB A1C WAIVED: HB A1C (BAYER DCA - WAIVED): 5.4 % (ref <7.0)

## 2018-05-30 LAB — MICROSCOPIC EXAMINATION: Bacteria, UA: NONE SEEN

## 2018-05-30 MED ORDER — LEVOTHYROXINE SODIUM 100 MCG PO TABS: 100.0000 ug | ORAL_TABLET | Freq: Every day | ORAL | 4 refills | Status: DC

## 2018-05-30 MED ORDER — BENAZEPRIL HCL 40 MG PO TABS: 40.0000 mg | ORAL_TABLET | Freq: Every day | ORAL | 4 refills | Status: DC

## 2018-05-30 MED ORDER — AMLODIPINE BESYLATE 5 MG PO TABS: 5.0000 mg | ORAL_TABLET | Freq: Every day | ORAL | 4 refills | Status: DC

## 2018-05-30 NOTE — Progress Notes (Signed)
BP 115/80 (BP Location: Left Arm, Patient Position: Sitting, Cuff Size: Normal)   Pulse (!) 102   Temp 98.7 F (37.1 C) (Oral)   Ht 5' 4.5" (1.638 m)   Wt 146 lb 1.6 oz (66.3 kg)   SpO2 96%   BMI 24.69 kg/m    Subjective:    Patient ID: Jerry Frey, male    DOB: 07/16/1951, 67 y.o.   MRN: [id number]  HPI: Jerry Frey is a 67 y.o. male  Chief Complaint  Patient presents with  . Annual Exam   Patient all in all doing well followed for hepatitis B and hemorrhoid with GI Dr. Marius Ditch.  Doing well with these issues. Thyroid taking thyroid replacement had dose increased and is feeling much better. Blood pressure doing well without problems. Relevant past medical, surgical, family and social history reviewed and updated as indicated. Interim medical history since our last visit reviewed. Allergies and medications reviewed and updated.  Review of Systems  Constitutional: Negative.   HENT: Negative.   Eyes: Negative.   Respiratory: Negative.   Cardiovascular: Negative.   Gastrointestinal: Negative.   Endocrine: Negative.   Genitourinary: Negative.   Musculoskeletal: Negative.   Skin: Negative.   Allergic/Immunologic: Negative.   Neurological: Negative.   Hematological: Negative.   Psychiatric/Behavioral: Negative.     Per HPI unless specifically indicated above     Objective:    BP 115/80 (BP Location: Left Arm, Patient Position: Sitting, Cuff Size: Normal)   Pulse (!) 102   Temp 98.7 F (37.1 C) (Oral)   Ht 5' 4.5" (1.638 m)   Wt 146 lb 1.6 oz (66.3 kg)   SpO2 96%   BMI 24.69 kg/m   Wt Readings from Last 3 Encounters:  05/30/18 146 lb 1.6 oz (66.3 kg)  05/23/18 149 lb (67.6 kg)  11/28/17 149 lb (67.6 kg)    Physical Exam  Constitutional: He is oriented to person, place, and time. He appears well-developed and well-nourished.  HENT:  Head: Normocephalic and atraumatic.  Right Ear: External ear normal.  Left Ear: External ear normal.  Eyes:  Pupils are equal, round, and reactive to light. Conjunctivae and EOM are normal.  Neck: Normal range of motion. Neck supple.  Cardiovascular: Normal rate, regular rhythm, normal heart sounds and intact distal pulses.  Pulmonary/Chest: Effort normal and breath sounds normal.  Abdominal: Soft. Bowel sounds are normal. There is no splenomegaly or hepatomegaly.  Genitourinary: Rectum normal, prostate normal and penis normal.  Musculoskeletal: Normal range of motion.  Neurological: He is alert and oriented to person, place, and time. He has normal reflexes.  Skin: No rash noted. No erythema.  Psychiatric: He has a normal mood and affect. His behavior is normal. Judgment and thought content normal.    Results for orders placed or performed in visit on 49/70/26  Basic metabolic panel  Result Value Ref Range   Glucose 228 (H) 65 - 99 mg/dL   BUN 13 8 - 27 mg/dL   Creatinine, Ser 1.16 0.76 - 1.27 mg/dL   GFR calc non Af Amer 64 >59 mL/min/1.73   GFR calc Af Amer 74 >59 mL/min/1.73   BUN/Creatinine Ratio 11 10 - 24   Sodium 144 134 - 144 mmol/L   Potassium 4.0 3.5 - 5.2 mmol/L   Chloride 103 96 - 106 mmol/L   CO2 25 20 - 29 mmol/L   Calcium 9.7 8.6 - 10.2 mg/dL      Assessment & Plan:   Problem List Items Addressed  This Visit      Cardiovascular and Mediastinum   Hypertension    The current medical regimen is effective;  continue present plan and medications.       Relevant Orders   Comprehensive metabolic panel   Lipid panel   CBC with Differential/Platelet   Urinalysis, Routine w reflex microscopic     Endocrine   Central hypothyroidism    The current medical regimen is effective;  continue present plan and medications.       Relevant Orders   Comprehensive metabolic panel   Lipid panel   CBC with Differential/Platelet   TSH   Urinalysis, Routine w reflex microscopic     Genitourinary   BPH (benign prostatic hyperplasia)    stable      Relevant Orders   PSA       Other   Elevated glucose - Primary    Patient's hemoglobin A1c today is normal. Discussion with patient probably related to alcohol discussed alcohol moderation and/or no alcohol.      Relevant Orders   Bayer DCA Hb A1c Waived   Lipid panel   CBC with Differential/Platelet   Urinalysis, Routine w reflex microscopic       Follow up plan: Return in about 6 months (around 11/29/2018) for Physical Exam, Hemoglobin A1c.

## 2018-05-30 NOTE — Assessment & Plan Note (Signed)
stable °

## 2018-05-30 NOTE — Assessment & Plan Note (Signed)
Patient's hemoglobin A1c today is normal. Discussion with patient probably related to alcohol discussed alcohol moderation and/or no alcohol.

## 2018-05-30 NOTE — Assessment & Plan Note (Signed)
The current medical regimen is effective;  continue present plan and medications.  

## 2018-05-30 NOTE — Telephone Encounter (Signed)
Patient would like all of his scripts sent to [facility] on [address] please.  He states he forgot to make sure Dr Jeananne Rama had this information.  Thank You

## 2018-05-31 ENCOUNTER — Encounter: Payer: Self-pay | Admitting: Family Medicine

## 2018-05-31 DIAGNOSIS — E221 Hyperprolactinemia: Secondary | ICD-10-CM | POA: Insufficient documentation

## 2018-05-31 LAB — CBC WITH DIFFERENTIAL/PLATELET
BASOS ABS: 0.1 10*3/uL (ref 0.0–0.2)
BASOS: 1 %
EOS (ABSOLUTE): 0.5 10*3/uL — AB (ref 0.0–0.4)
Eos: 9 %
Hematocrit: 42.2 % (ref 37.5–51.0)
Hemoglobin: 14.1 g/dL (ref 13.0–17.7)
IMMATURE GRANULOCYTES: 0 %
Immature Grans (Abs): 0 10*3/uL (ref 0.0–0.1)
Lymphocytes Absolute: 1.4 10*3/uL (ref 0.7–3.1)
Lymphs: 26 %
MCH: 30.5 pg (ref 26.6–33.0)
MCHC: 33.4 g/dL (ref 31.5–35.7)
MCV: 91 fL (ref 79–97)
MONOS ABS: 0.6 10*3/uL (ref 0.1–0.9)
Monocytes: 11 %
NEUTROS ABS: 2.9 10*3/uL (ref 1.4–7.0)
NEUTROS PCT: 53 %
PLATELETS: 188 10*3/uL (ref 150–450)
RBC: 4.63 x10E6/uL (ref 4.14–5.80)
RDW: 14 % (ref 12.3–15.4)
WBC: 5.4 10*3/uL (ref 3.4–10.8)

## 2018-05-31 LAB — LIPID PANEL
CHOL/HDL RATIO: 4.2 ratio (ref 0.0–5.0)
CHOLESTEROL TOTAL: 208 mg/dL — AB (ref 100–199)
HDL: 50 mg/dL (ref >39)
LDL CALC: 99 mg/dL (ref 0–99)
Triglycerides: 296 mg/dL — ABNORMAL HIGH (ref 0–149)
VLDL CHOLESTEROL CAL: 59 mg/dL — AB (ref 5–40)

## 2018-05-31 LAB — COMPREHENSIVE METABOLIC PANEL
ALBUMIN: 3.9 g/dL (ref 3.6–4.8)
ALK PHOS: 51 IU/L (ref 39–117)
ALT: 28 IU/L (ref 0–44)
AST: 29 IU/L (ref 0–40)
Albumin/Globulin Ratio: 1.4 (ref 1.2–2.2)
BUN / CREAT RATIO: 27 — AB (ref 10–24)
BUN: 22 mg/dL (ref 8–27)
Bilirubin Total: 0.4 mg/dL (ref 0.0–1.2)
CALCIUM: 8.9 mg/dL (ref 8.6–10.2)
CO2: 21 mmol/L (ref 20–29)
Chloride: 107 mmol/L — ABNORMAL HIGH (ref 96–106)
Creatinine, Ser: 0.83 mg/dL (ref 0.76–1.27)
GFR calc Af Amer: 104 mL/min/{1.73_m2} (ref >59)
GFR calc non Af Amer: 90 mL/min/{1.73_m2} (ref >59)
GLOBULIN, TOTAL: 2.7 g/dL (ref 1.5–4.5)
GLUCOSE: 137 mg/dL — AB (ref 65–99)
Potassium: 3.9 mmol/L (ref 3.5–5.2)
Sodium: 143 mmol/L (ref 134–144)
TOTAL PROTEIN: 6.6 g/dL (ref 6.0–8.5)

## 2018-05-31 LAB — TSH: TSH: <0.006 u[IU]/mL — ABNORMAL LOW (ref 0.450–4.500)

## 2018-05-31 LAB — PSA: Prostate Specific Ag, Serum: 0.6 ng/mL (ref 0.0–4.0)

## 2018-08-23 DIAGNOSIS — E221 Hyperprolactinemia: Secondary | ICD-10-CM | POA: Diagnosis not present

## 2018-08-23 DIAGNOSIS — E038 Other specified hypothyroidism: Secondary | ICD-10-CM | POA: Diagnosis not present

## 2018-08-23 DIAGNOSIS — I10 Essential (primary) hypertension: Secondary | ICD-10-CM | POA: Diagnosis not present

## 2018-12-26 ENCOUNTER — Ambulatory Visit: Payer: Medicare Other | Admitting: Family Medicine

## 2019-05-27 ENCOUNTER — Ambulatory Visit (INDEPENDENT_AMBULATORY_CARE_PROVIDER_SITE_OTHER): Payer: Medicare Other

## 2019-05-27 VITALS — BP 122/88 | HR 81 | Ht 64.5 in | Wt 143.0 lb

## 2019-05-27 DIAGNOSIS — Z0000 Encounter for general adult medical examination without abnormal findings: Secondary | ICD-10-CM | POA: Diagnosis not present

## 2019-05-27 NOTE — Patient Instructions (Signed)
Jerry Frey , Thank you for taking time to come for your Medicare Wellness Visit. I appreciate your ongoing commitment to your health goals. Please review the following plan we discussed and let me know if I can assist you in the future.   Screening recommendations/referrals: Colonoscopy: completed 09/2009, due 09/2019 Recommended yearly ophthalmology/optometry visit for glaucoma screening and checkup Recommended yearly dental visit for hygiene and checkup  Vaccinations: Influenza vaccine: due now  Pneumococcal vaccine: up to date Tdap vaccine: up to date Shingles vaccine: n/a    Advanced directives: please let us know if you have any questions regarding this paperwork   Conditions/risks identified: none   Next appointment: Follow up in one year for your annual wellness visit   Preventive Care 70 Years and Older, Male Preventive care refers to lifestyle choices and visits with your health care provider that can promote health and wellness. What does preventive care include?  A yearly physical exam. This is also called an annual well check.  Dental exams once or twice a year.  Routine eye exams. Ask your health care provider how often you should have your eyes checked.  Personal lifestyle choices, including:  Daily care of your teeth and gums.  Regular physical activity.  Eating a healthy diet.  Avoiding tobacco and drug use.  Limiting alcohol use.  Practicing safe sex.  Taking low doses of aspirin every day.  Taking vitamin and mineral supplements as recommended by your health care provider. What happens during an annual well check? The services and screenings done by your health care provider during your annual well check will depend on your age, overall health, lifestyle risk factors, and family history of disease. Counseling  Your health care provider may ask you questions about your:  Alcohol use.  Tobacco use.  Drug use.  Emotional well-being.  Home  and relationship well-being.  Sexual activity.  Eating habits.  History of falls.  Memory and ability to understand (cognition).  Work and work Statistician. Screening  You may have the following tests or measurements:  Height, weight, and BMI.  Blood pressure.  Lipid and cholesterol levels. These may be checked every 5 years, or more frequently if you are over 73 years old.  Skin check.  Lung cancer screening. You may have this screening every year starting at age 61 if you have a 30-pack-year history of smoking and currently smoke or have quit within the past 15 years.  Fecal occult blood test (FOBT) of the stool. You may have this test every year starting at age 16.  Flexible sigmoidoscopy or colonoscopy. You may have a sigmoidoscopy every 5 years or a colonoscopy every 10 years starting at age 15.  Prostate cancer screening. Recommendations will vary depending on your family history and other risks.  Hepatitis C blood test.  Hepatitis B blood test.  Sexually transmitted disease (STD) testing.  Diabetes screening. This is done by checking your blood sugar (glucose) after you have not eaten for a while (fasting). You may have this done every 1-3 years.  Abdominal aortic aneurysm (AAA) screening. You may need this if you are a current or former smoker.  Osteoporosis. You may be screened starting at age 87 if you are at high risk. Talk with your health care provider about your test results, treatment options, and if necessary, the need for more tests. Vaccines  Your health care provider may recommend certain vaccines, such as:  Influenza vaccine. This is recommended every year.  Tetanus, diphtheria, and  acellular pertussis (Tdap, Td) vaccine. You may need a Td booster every 10 years.  Zoster vaccine. You may need this after age 58.  Pneumococcal 13-valent conjugate (PCV13) vaccine. One dose is recommended after age 68.  Pneumococcal polysaccharide (PPSV23) vaccine.  One dose is recommended after age 83. Talk to your health care provider about which screenings and vaccines you need and how often you need them. This information is not intended to replace advice given to you by your health care provider. Make sure you discuss any questions you have with your health care provider. Document Released: 08/14/2015 Document Revised: 04/06/2016 Document Reviewed: 05/19/2015 Elsevier Interactive Patient Education  2017 [place] Prevention in the Home Falls can cause injuries. They can happen to people of all ages. There are many things you can do to make your home safe and to help prevent falls. What can I do on the outside of my home?  Regularly fix the edges of walkways and driveways and fix any cracks.  Remove anything that might make you trip as you walk through a door, such as a raised step or threshold.  Trim any bushes or trees on the path to your home.  Use bright outdoor lighting.  Clear any walking paths of anything that might make someone trip, such as rocks or tools.  Regularly check to see if handrails are loose or broken. Make sure that both sides of any steps have handrails.  Any raised decks and porches should have guardrails on the edges.  Have any leaves, snow, or ice cleared regularly.  Use sand or salt on walking paths during winter.  Clean up any spills in your garage right away. This includes oil or grease spills. What can I do in the bathroom?  Use night lights.  Install grab bars by the toilet and in the tub and shower. Do not use towel bars as grab bars.  Use non-skid mats or decals in the tub or shower.  If you need to sit down in the shower, use a plastic, non-slip stool.  Keep the floor dry. Clean up any water that spills on the floor as soon as it happens.  Remove soap buildup in the tub or shower regularly.  Attach bath mats securely with double-sided non-slip rug tape.  Do not have throw rugs and other  things on the floor that can make you trip. What can I do in the bedroom?  Use night lights.  Make sure that you have a light by your bed that is easy to reach.  Do not use any sheets or blankets that are too big for your bed. They should not hang down onto the floor.  Have a firm chair that has side arms. You can use this for support while you get dressed.  Do not have throw rugs and other things on the floor that can make you trip. What can I do in the kitchen?  Clean up any spills right away.  Avoid walking on wet floors.  Keep items that you use a lot in easy-to-reach places.  If you need to reach something above you, use a strong step stool that has a grab bar.  Keep electrical cords out of the way.  Do not use floor polish or wax that makes floors slippery. If you must use wax, use non-skid floor wax.  Do not have throw rugs and other things on the floor that can make you trip. What can I do with my stairs?  Do not leave any items on the stairs.  Make sure that there are handrails on both sides of the stairs and use them. Fix handrails that are broken or loose. Make sure that handrails are as long as the stairways.  Check any carpeting to make sure that it is firmly attached to the stairs. Fix any carpet that is loose or worn.  Avoid having throw rugs at the top or bottom of the stairs. If you do have throw rugs, attach them to the floor with carpet tape.  Make sure that you have a light switch at the top of the stairs and the bottom of the stairs. If you do not have them, ask someone to add them for you. What else can I do to help prevent falls?  Wear shoes that:  Do not have high heels.  Have rubber bottoms.  Are comfortable and fit you well.  Are closed at the toe. Do not wear sandals.  If you use a stepladder:  Make sure that it is fully opened. Do not climb a closed stepladder.  Make sure that both sides of the stepladder are locked into place.  Ask  someone to hold it for you, if possible.  Clearly mark and make sure that you can see:  Any grab bars or handrails.  First and last steps.  Where the edge of each step is.  Use tools that help you move around (mobility aids) if they are needed. These include:  Canes.  Walkers.  Scooters.  Crutches.  Turn on the lights when you go into a dark area. Replace any light bulbs as soon as they burn out.  Set up your furniture so you have a clear path. Avoid moving your furniture around.  If any of your floors are uneven, fix them.  If there are any pets around you, be aware of where they are.  Review your medicines with your doctor. Some medicines can make you feel dizzy. This can increase your chance of falling. Ask your doctor what other things that you can do to help prevent falls. This information is not intended to replace advice given to you by your health care provider. Make sure you discuss any questions you have with your health care provider. Document Released: 05/14/2009 Document Revised: 12/24/2015 Document Reviewed: 08/22/2014 Elsevier Interactive Patient Education  2017 [employer].

## 2019-05-27 NOTE — Progress Notes (Signed)
Subjective:   Jerry Frey is a 68 y.o. male who presents for Medicare Annual/Subsequent preventive examination.  This visit is being conducted via phone call  - after an attmept to do on video chat - due to the COVID-19 pandemic. This patient has given me verbal consent via phone to conduct this visit, patient states they are participating from their home address. Some vital signs may be absent or patient reported.   Patient identification: identified by name, DOB, and current address.    Review of Systems:   Cardiac Risk Factors include: advanced age (>33men, >66 women);male gender;dyslipidemia;hypertension     Objective:    Vitals: BP 122/88 Comment: pt reported  Pulse 81 Comment: pt reported  Ht 5' 4.5" (1.638 m)   Wt 143 lb (64.9 kg) Comment: pt reported  BMI 24.17 kg/m   Body mass index is 24.17 kg/m.  Advanced Directives 05/27/2019 05/23/2018 05/23/2018 05/03/2017  Does Patient Have a Medical Advance Directive? No No No No  Type of Advance Directive - - Public librarian;Living will  Would patient like information on creating a medical advance directive? - Yes (MAU/Ambulatory/Procedural Areas - Information given) Yes (MAU/Ambulatory/Procedural Areas - Information given) -    Tobacco Social History   Tobacco Use  Smoking Status Never Smoker  Smokeless Tobacco Never Used     Counseling given: Not Answered   Clinical Intake:  Pre-visit preparation completed: Yes  Pain : No/denies pain     Nutritional Risks: None Diabetes: No  How often do you need to have someone help you when you read instructions, pamphlets, or other written materials from your doctor or pharmacy?: 1 - Never  Interpreter Needed?: No  Information entered by ::  ,LPN  Past Medical History:  Diagnosis Date  . Hypertension   . Thyroid disease    Past Surgical History:  Procedure Laterality Date  . L4 compression fracture     Family History  Family  history unknown: Yes   Social History   Socioeconomic History  . Marital status: Married    Spouse name: Not on file  . Number of children: Not on file  . Years of education: Not on file  . Highest education level: 12th grade  Occupational History  . Occupation: retired  Scientific laboratory technician  . Financial resource strain: Somewhat hard  . Food insecurity    Worry: Never true    Inability: Never true  . Transportation needs    Medical: No    Non-medical: No  Tobacco Use  . Smoking status: Never Smoker  . Smokeless tobacco: Never Used  Substance and Sexual Activity  . Alcohol use: Yes    Alcohol/week: 24.0 standard drinks    Types: 24 Cans of beer per week    Comment: per week  . Drug use: No  . Sexual activity: Not on file  Lifestyle  . Physical activity    Days per week: 0 days    Minutes per session: 0 min  . Stress: Patient refused  Relationships  . Social Herbalist on phone: Once a week    Gets together: Once a week    Attends religious service: Never    Active member of club or organization: No    Attends meetings of clubs or organizations: Never    Relationship status: Married  Other Topics Concern  . Not on file  Social History Narrative  . Not on file    Outpatient Encounter Medications as of  05/27/2019  Medication Sig  . amLODipine (NORVASC) 5 MG tablet Take 1 tablet (5 mg total) by mouth daily.  . benazepril (LOTENSIN) 40 MG tablet Take 1 tablet (40 mg total) by mouth daily.  Marland Kitchen levothyroxine (SYNTHROID, LEVOTHROID) 100 MCG tablet Take 1 tablet (100 mcg total) by mouth daily before breakfast.   No facility-administered encounter medications on file as of 05/27/2019.     Activities of Daily Living In your present state of health, do you have any difficulty performing the following activities: 05/27/2019  Hearing? Y  Comment right ear sometimes, no hearing aids  Vision? N  Comment eyeglasses, goes to [facility] as needed  Difficulty  concentrating or making decisions? N  Walking or climbing stairs? N  Dressing or bathing? N  Doing errands, shopping? N  Preparing Food and eating ? N  Using the Toilet? N  In the past six months, have you accidently leaked urine? N  Do you have problems with loss of bowel control? N  Managing your Medications? N  Managing your Finances? N  Housekeeping or managing your Housekeeping? N  Some recent data might be hidden    Patient Care Team: Guadalupe Maple, MD as PCP - General (Family Medicine) Hiram Gash, MD as Referring Physician (Orthopedic Surgery) Judi Cong, MD as Physician Assistant (Endocrinology) Lin Landsman, MD as Consulting Physician (Gastroenterology)   Assessment:   This is a routine wellness examination for Jerry Frey.  Exercise Activities and Dietary recommendations Current Exercise Habits: The patient does not participate in regular exercise at present(stays active), Exercise limited by: None identified  Goals    . DIET - INCREASE WATER INTAKE     Increase water intake to 4 bottles per day       Fall Risk: Fall Risk  05/27/2019 05/23/2018 11/28/2017 05/03/2017 05/02/2016  Falls in the past year? 0 No No No No  Number falls in past yr: 0 - - - -  Injury with Fall? 0 - - - -    FALL RISK PREVENTION PERTAINING TO THE HOME:  Any stairs in or around the home? Yes  If so, are there any without handrails? No   Home free of loose throw rugs in walkways, pet beds, electrical cords, etc? Yes  Adequate lighting in your home to reduce risk of falls? Yes   ASSISTIVE DEVICES UTILIZED TO PREVENT FALLS:  Life alert? No  Use of a cane, walker or w/c? No  Grab bars in the bathroom? No  Shower chair or bench in shower? No  Elevated toilet seat or a handicapped toilet? No   TIMED UP AND GO:  Unable to perform   Depression Screen PHQ 2/9 Scores 05/27/2019 05/23/2018 11/28/2017 05/03/2017  PHQ - 2 Score 0 0 0 0    Cognitive Function     6CIT  Screen 05/23/2018 05/03/2017  What Year? 0 points 0 points  What month? 0 points 0 points  What time? 0 points 0 points  Count back from 20 0 points 0 points  Months in reverse 0 points 0 points  Repeat phrase 6 points 8 points  Total Score 6 8    Immunization History  Administered Date(s) Administered  . Influenza, High Dose Seasonal PF 05/02/2016, 05/23/2018  . Influenza-Unspecified 06/30/2014, 05/20/2015  . Pneumococcal Conjugate-13 05/02/2016  . Pneumococcal Polysaccharide-23 05/03/2017  . Td 09/28/2014    Qualifies for Shingles Vaccine? No  didn't have chicken pox   Tdap: up to date   Flu  Vaccine: Due for Flu vaccine. Will get at pharmacy   Pneumococcal Vaccine: up to date   Screening Tests Health Maintenance  Topic Date Due  . INFLUENZA VACCINE  03/02/2019  . COLONOSCOPY  09/08/2019  . TETANUS/TDAP  09/28/2024  . Hepatitis C Screening  Completed  . PNA vac Low Risk Adult  Completed   Cancer Screenings:  Colorectal Screening: Completed 2011. Repeat every 10 years  Lung Cancer Screening: (Low Dose CT Chest recommended if Age 33-80 years, 30 pack-year currently smoking OR have quit w/in 15years.) does not qualify.     Additional Screening:  Hepatitis C Screening: does qualify; Completed   Vision Screening: Recommended annual ophthalmology exams for early detection of glaucoma and other disorders of the eye. Is the patient up to date with their annual eye exam?  No  Who is the provider or what is the name of the office in which the pt attends annual eye exams? [facility] as needed   Dental Screening: Recommended annual dental exams for proper oral hygiene  Community Resource Referral:  CRR required this visit?  No        Plan:  I have personally reviewed and addressed the Medicare Annual Wellness questionnaire and have noted the following in the patient's chart:  A. Medical and social history B. Use of alcohol, tobacco or illicit drugs  C.  Current medications and supplements D. Functional ability and status E.  Nutritional status F.  Physical activity G. Advance directives H. List of other physicians I.  Hospitalizations, surgeries, and ER visits in previous 12 months J.  [place] such as hearing and vision if needed, cognitive and depression L. Referrals and appointments   In addition, I have reviewed and discussed with patient certain preventive protocols, quality metrics, and best practice recommendations. A written personalized care plan for preventive services as well as general preventive health recommendations were provided to patient.   Signed,   Bevelyn Ngo, LPN  [phone] Nurse Health Advisor   Nurse Notes: none

## 2019-07-09 DIAGNOSIS — Z23 Encounter for immunization: Secondary | ICD-10-CM | POA: Diagnosis not present

## 2019-08-13 ENCOUNTER — Other Ambulatory Visit: Payer: Self-pay | Admitting: Family Medicine

## 2019-08-13 DIAGNOSIS — I10 Essential (primary) hypertension: Secondary | ICD-10-CM

## 2019-08-13 MED ORDER — BENAZEPRIL HCL 40 MG PO TABS: 40.0000 mg | ORAL_TABLET | Freq: Every day | ORAL | 0 refills | Status: DC

## 2019-08-13 MED ORDER — AMLODIPINE BESYLATE 5 MG PO TABS: 5.0000 mg | ORAL_TABLET | Freq: Every day | ORAL | 0 refills | Status: DC

## 2019-08-13 NOTE — Telephone Encounter (Signed)
Requested Prescriptions  Pending Prescriptions Disp Refills  . amLODipine (NORVASC) 5 MG tablet 90 tablet 4    Sig: Take 1 tablet (5 mg total) by mouth daily.     Cardiovascular:  Calcium Channel Blockers Passed - 08/13/2019 10:55 AM      Passed - Last BP in normal range    BP Readings from Last 1 Encounters:  05/27/19 122/88         Passed - Valid encounter within last 6 months    Recent Outpatient Visits          1 year ago Elevated glucose   [facility] Jeananne Rama, Jeannette How, MD   1 year ago Essential hypertension   [facility] Criss[place], Jeannette How, MD   2 years ago Hyperthyroidism   [place] Criss[place], Jeannette How, MD   2 years ago Essential hypertension   [place], Jeannette How, MD   3 years ago Immunization due   [place], MD      Future Appointments            In 9 months [place], [facility]            . benazepril (LOTENSIN) 40 MG tablet 90 tablet 4    Sig: Take 1 tablet (40 mg total) by mouth daily.     Cardiovascular:  ACE Inhibitors Failed - 08/13/2019 10:55 AM      Failed - Cr in normal range and within 180 days    Creatinine, Ser  Date Value Ref Range Status  05/30/2018 0.83 0.76 - 1.27 mg/dL Final         Failed - K in normal range and within 180 days    Potassium  Date Value Ref Range Status  05/30/2018 3.9 3.5 - 5.2 mmol/L Final         Passed - Patient is not pregnant      Passed - Last BP in normal range    BP Readings from Last 1 Encounters:  05/27/19 122/88         Passed - Valid encounter within last 6 months    Recent Outpatient Visits          1 year ago Elevated glucose   [facility] Guadalupe Maple, MD   1 year ago Essential hypertension   [facility] Crissman, Jeannette How, MD   2 years ago Hyperthyroidism   [place] Criss[place], Jeannette How, MD   2 years ago Essential hypertension   [place], Jeannette How, MD   3 years ago Immunization due   [place], MD      Future Appointments            In 9 months [place], [place]

## 2019-08-13 NOTE — Telephone Encounter (Signed)
Medication Refill - Medication: amLODipine (NORVASC) 5 MG tablet /benazepril (LOTENSIN) 40 MG tablet  Has the patient contacted their pharmacy? Yes.   (Agent: If no, request that the patient contact the pharmacy for the refill.) (Agent: If yes, when and what did the pharmacy advise?)  Preferred Pharmacy (with phone number or street name):  [facility] [place] [facility] [place] Phone:  [phone]  Fax:  [phone]       Agent: Please be advised that RX refills may take up to 3 business days. We ask that you follow-up with your pharmacy.

## 2019-08-29 DIAGNOSIS — E221 Hyperprolactinemia: Secondary | ICD-10-CM | POA: Diagnosis not present

## 2019-08-29 DIAGNOSIS — E038 Other specified hypothyroidism: Secondary | ICD-10-CM | POA: Diagnosis not present

## 2019-08-29 DIAGNOSIS — I10 Essential (primary) hypertension: Secondary | ICD-10-CM | POA: Diagnosis not present

## 2019-09-26 DIAGNOSIS — Z23 Encounter for immunization: Secondary | ICD-10-CM | POA: Diagnosis not present

## 2019-10-24 DIAGNOSIS — Z23 Encounter for immunization: Secondary | ICD-10-CM | POA: Diagnosis not present

## 2019-11-21 ENCOUNTER — Other Ambulatory Visit: Payer: Self-pay | Admitting: Family Medicine

## 2019-11-21 DIAGNOSIS — I10 Essential (primary) hypertension: Secondary | ICD-10-CM

## 2020-03-10 ENCOUNTER — Other Ambulatory Visit: Payer: Self-pay | Admitting: Family Medicine

## 2020-03-10 DIAGNOSIS — I10 Essential (primary) hypertension: Secondary | ICD-10-CM

## 2020-03-13 ENCOUNTER — Other Ambulatory Visit: Payer: Self-pay | Admitting: Family Medicine

## 2020-03-13 DIAGNOSIS — I10 Essential (primary) hypertension: Secondary | ICD-10-CM

## 2020-03-17 ENCOUNTER — Other Ambulatory Visit: Payer: Self-pay | Admitting: Family Medicine

## 2020-03-17 DIAGNOSIS — I10 Essential (primary) hypertension: Secondary | ICD-10-CM

## 2020-05-28 ENCOUNTER — Ambulatory Visit: Payer: Medicare Other

## 2020-05-29 ENCOUNTER — Ambulatory Visit (INDEPENDENT_AMBULATORY_CARE_PROVIDER_SITE_OTHER): Payer: Medicare Other

## 2020-05-29 VITALS — Ht 63.5 in | Wt 146.0 lb

## 2020-05-29 DIAGNOSIS — Z0000 Encounter for general adult medical examination without abnormal findings: Secondary | ICD-10-CM

## 2020-05-29 NOTE — Progress Notes (Signed)
I connected with [name] today by telephone and verified that I am speaking with the correct person using two identifiers. Location patient: home Location provider: work Persons participating in the virtual visit: Fields, Oros LPN.   I discussed the limitations, risks, security and privacy concerns of performing an evaluation and management service by telephone and the availability of in person appointments. I also discussed with the patient that there may be a patient responsible charge related to this service. The patient expressed understanding and verbally consented to this telephonic visit.    Interactive audio and video telecommunications were attempted between this provider and patient, however failed, due to patient having technical difficulties OR patient did not have access to video capability.  We continued and completed visit with audio only.     Vital signs may be patient reported or missing.  Subjective:   Jerry Frey is a 69 y.o. male who presents for Medicare Annual/Subsequent preventive examination.  Review of Systems     Cardiac Risk Factors include: advanced age (>89men, >42 women);hypertension;male gender     Objective:    Today's Vitals   05/29/20 1426  Weight: 146 lb (66.2 kg)  Height: 5' 3.5" (1.613 m)   Body mass index is 25.46 kg/m.  Advanced Directives 05/29/2020 05/27/2019 05/23/2018 05/23/2018 05/03/2017  Does Patient Have a Medical Advance Directive? No No No No No  Type of Advance Directive - - - - Press photographer;Living will  Would patient like information on creating a medical advance directive? - - Yes (MAU/Ambulatory/Procedural Areas - Information given) Yes (MAU/Ambulatory/Procedural Areas - Information given) -    Current Medications (verified) Outpatient Encounter Medications as of 05/29/2020  Medication Sig  . amLODipine (NORVASC) 5 MG tablet Take 1 tablet by mouth once daily  . benazepril  (LOTENSIN) 40 MG tablet Take 1 tablet by mouth once daily  . levothyroxine (SYNTHROID, LEVOTHROID) 100 MCG tablet Take 1 tablet (100 mcg total) by mouth daily before breakfast.   No facility-administered encounter medications on file as of 05/29/2020.    Allergies (verified) Patient has no known allergies.   History: Past Medical History:  Diagnosis Date  . Hypertension   . Thyroid disease    Past Surgical History:  Procedure Laterality Date  . L4 compression fracture     Family History  Family history unknown: Yes   Social History   Socioeconomic History  . Marital status: Married    Spouse name: Not on file  . Number of children: Not on file  . Years of education: Not on file  . Highest education level: 12th grade  Occupational History  . Occupation: retired  Tobacco Use  . Smoking status: Never Smoker  . Smokeless tobacco: Never Used  Vaping Use  . Vaping Use: Never used  Substance and Sexual Activity  . Alcohol use: Not Currently    Alcohol/week: 7.0 standard drinks    Types: 7 Glasses of wine per week    Comment: per week  . Drug use: No  . Sexual activity: Not on file  Other Topics Concern  . Not on file  Social History Narrative  . Not on file   Social Determinants of Health   Financial Resource Strain: Low Risk   . Difficulty of Paying Living Expenses: Not hard at all  Food Insecurity: No Food Insecurity  . Worried About Charity fundraiser in the Last Year: Never true  . Ran Out of Food in the Last Year: Never  true  Transportation Needs: No Transportation Needs  . Lack of Transportation (Medical): No  . Lack of Transportation (Non-Medical): No  Physical Activity: Inactive  . Days of Exercise per Week: 0 days  . Minutes of Exercise per Session: 0 min  Stress: No Stress Concern Present  . Feeling of Stress : Not at all  Social Connections:   . Frequency of Communication with Friends and Family: Not on file  . Frequency of Social Gatherings with  Friends and Family: Not on file  . Attends Religious Services: Not on file  . Active Member of Clubs or Organizations: Not on file  . Attends Archivist Meetings: Not on file  . Marital Status: Not on file    Tobacco Counseling Counseling given: Not Answered   Clinical Intake:  Pre-visit preparation completed: Yes  Pain : No/denies pain     Nutritional Status: BMI 25 -29 Overweight Nutritional Risks: None Diabetes: No  How often do you need to have someone help you when you read instructions, pamphlets, or other written materials from your doctor or pharmacy?: 1 - Never  Diabetic? no  Interpreter Needed?: No  Information entered by :: NAllen LPN   Activities of Daily Living In your present state of health, do you have any difficulty performing the following activities: 05/29/2020  Hearing? N  Vision? N  Difficulty concentrating or making decisions? N  Walking or climbing stairs? N  Dressing or bathing? N  Doing errands, shopping? N  Preparing Food and eating ? N  Using the Toilet? N  In the past six months, have you accidently leaked urine? N  Do you have problems with loss of bowel control? N  Managing your Medications? N  Managing your Finances? N  Housekeeping or managing your Housekeeping? N  Some recent data might be hidden    Patient Care Team: Guadalupe Maple, MD as PCP - General (Family Medicine) Hiram Gash, MD as Referring Physician (Orthopedic Surgery) Judi Cong, MD as Physician Assistant (Endocrinology) Lin Landsman, MD as Consulting Physician (Gastroenterology)  Indicate any recent Medical Services you may have received from other than [facility] providers in the past year (date may be approximate).     Assessment:   This is a routine wellness examination for [name].  Hearing/Vision screen  Hearing Screening   125Hz  250Hz  500Hz  1000Hz  2000Hz  3000Hz  4000Hz  6000Hz  8000Hz   Right ear:           Left ear:             Vision Screening Comments: No regular eye exams  Dietary issues and exercise activities discussed:    Goals    . DIET - INCREASE WATER INTAKE     Increase water intake to 4 bottles per day    . Patient Stated     05/29/2020, no goals      Depression Screen PHQ 2/9 Scores 05/29/2020 05/27/2019 05/23/2018 11/28/2017 05/03/2017 05/02/2016 04/30/2015  PHQ - 2 Score 0 0 0 0 0 0 0  PHQ- 9 Score 0 - - - - - -    Fall Risk Fall Risk  05/29/2020 05/27/2019 05/23/2018 11/28/2017 05/03/2017  Falls in the past year? 0 0 No No No  Number falls in past yr: - 0 - - -  Injury with Fall? - 0 - - -  Risk for fall due to : Medication side effect - - - -  Follow up Falls evaluation completed;Education provided;Falls prevention discussed - - - -  Any stairs in or around the home? Yes  If so, are there any without handrails? No  Home free of loose throw rugs in walkways, pet beds, electrical cords, etc? Yes  Adequate lighting in your home to reduce risk of falls? Yes   ASSISTIVE DEVICES UTILIZED TO PREVENT FALLS:  Life alert? No  Use of a cane, walker or w/c? No  Grab bars in the bathroom? No  Shower chair or bench in shower? No  Elevated toilet seat or a handicapped toilet? No   TIMED UP AND GO:  Was the test performed? No .     Cognitive Function:     6CIT Screen 05/29/2020 05/23/2018 05/03/2017  What Year? 0 points 0 points 0 points  What month? 0 points 0 points 0 points  What time? 0 points 0 points 0 points  Count back from 20 2 points 0 points 0 points  Months in reverse 0 points 0 points 0 points  Repeat phrase 2 points 6 points 8 points  Total Score 4 6 8     Immunizations Immunization History  Administered Date(s) Administered  . Influenza, High Dose Seasonal PF 05/02/2016, 05/23/2018  . Influenza-Unspecified 06/30/2014, 05/20/2015  . PFIZER SARS-COV-2 Vaccination 09/26/2019, 10/24/2019  . Pneumococcal Conjugate-13 05/02/2016  . Pneumococcal Polysaccharide-23  05/03/2017  . Td 09/28/2014    TDAP status: Up to date Flu Vaccine status: planning on getting Pneumococcal vaccine status: Up to date Covid-19 vaccine status: Completed vaccines  Qualifies for Shingles Vaccine? Yes   Zostavax completed No   Shingrix Completed?: No.    Education has been provided regarding the importance of this vaccine. Patient has been advised to call insurance company to determine out of pocket expense if they have not yet received this vaccine. Advised may also receive vaccine at local pharmacy or Health Dept. Verbalized acceptance and understanding.  Screening Tests Health Maintenance  Topic Date Due  . COLONOSCOPY  09/08/2019  . INFLUENZA VACCINE  03/01/2020  . TETANUS/TDAP  09/28/2024  . COVID-19 Vaccine  Completed  . Hepatitis C Screening  Completed  . PNA vac Low Risk Adult  Completed    Health Maintenance  Health Maintenance Due  Topic Date Due  . COLONOSCOPY  09/08/2019  . INFLUENZA VACCINE  03/01/2020    Colorectal cancer screening: wants to do next year  Lung Cancer Screening: (Low Dose CT Chest recommended if Age 74-80 years, 30 pack-year currently smoking OR have quit w/in 15years.) does not qualify.   Lung Cancer Screening Referral: no  Additional Screening:  Hepatitis C Screening: does qualify; Completed 05/02/2016  Vision Screening: Recommended annual ophthalmology exams for early detection of glaucoma and other disorders of the eye. Is the patient up to date with their annual eye exam?  No  Who is the provider or what is the name of the office in which the patient attends annual eye exams? none If pt is not established with a provider, would they like to be referred to a provider to establish care? No .   Dental Screening: Recommended annual dental exams for proper oral hygiene  Community Resource Referral / Chronic Care Management: CRR required this visit?  No   CCM required this visit?  No      Plan:     I have personally  reviewed and noted the following in the patient's chart:   . Medical and social history . Use of alcohol, tobacco or illicit drugs  . Current medications and supplements . Functional ability and status .  Nutritional status . Physical activity . Advanced directives . List of other physicians . Hospitalizations, surgeries, and ER visits in previous 12 months . Vitals . Screenings to include cognitive, depression, and falls . Referrals and appointments  In addition, I have reviewed and discussed with patient certain preventive protocols, quality metrics, and best practice recommendations. A written personalized care plan for preventive services as well as general preventive health recommendations were provided to patient.     Kellie Simmering, LPN   [id number]/03/[id number]   Nurse Notes:

## 2020-05-29 NOTE — Patient Instructions (Signed)
[name] , Thank you for taking time to come for your Medicare Wellness Visit. I appreciate your ongoing commitment to your health goals. Please review the following plan we discussed and let me know if I can assist you in the future.   Screening recommendations/referrals: Colonoscopy: states will do next year Recommended yearly ophthalmology/optometry visit for glaucoma screening and checkup Recommended yearly dental visit for hygiene and checkup  Vaccinations: Influenza vaccine: due Pneumococcal vaccine: completed 05/03/2017 Tdap vaccine: completed 09/28/2014, due 09/28/2024 Shingles vaccine: discussed   Covid-19:  09/26/2019, 10/24/2019  Advanced directives: Advance directive discussed with you today.    Conditions/risks identified: none  Next appointment: Follow up in one year for your annual wellness visit.   Preventive Care 37 Years and Older, Male Preventive care refers to lifestyle choices and visits with your health care provider that can promote health and wellness. What does preventive care include?  A yearly physical exam. This is also called an annual well check.  Dental exams once or twice a year.  Routine eye exams. Ask your health care provider how often you should have your eyes checked.  Personal lifestyle choices, including:  Daily care of your teeth and gums.  Regular physical activity.  Eating a healthy diet.  Avoiding tobacco and drug use.  Limiting alcohol use.  Practicing safe sex.  Taking low doses of aspirin every day.  Taking vitamin and mineral supplements as recommended by your health care provider. What happens during an annual well check? The services and screenings done by your health care provider during your annual well check will depend on your age, overall health, lifestyle risk factors, and family history of disease. Counseling  Your health care provider may ask you questions about your:  Alcohol use.  Tobacco use.  Drug  use.  Emotional well-being.  Home and relationship well-being.  Sexual activity.  Eating habits.  History of falls.  Memory and ability to understand (cognition).  Work and work Statistician. Screening  You may have the following tests or measurements:  Height, weight, and BMI.  Blood pressure.  Lipid and cholesterol levels. These may be checked every 5 years, or more frequently if you are over 71 years old.  Skin check.  Lung cancer screening. You may have this screening every year starting at age 22 if you have a 30-pack-year history of smoking and currently smoke or have quit within the past 15 years.  Fecal occult blood test (FOBT) of the stool. You may have this test every year starting at age 40.  Flexible sigmoidoscopy or colonoscopy. You may have a sigmoidoscopy every 5 years or a colonoscopy every 10 years starting at age 22.  Prostate cancer screening. Recommendations will vary depending on your family history and other risks.  Hepatitis C blood test.  Hepatitis B blood test.  Sexually transmitted disease (STD) testing.  Diabetes screening. This is done by checking your blood sugar (glucose) after you have not eaten for a while (fasting). You may have this done every 1-3 years.  Abdominal aortic aneurysm (AAA) screening. You may need this if you are a current or former smoker.  Osteoporosis. You may be screened starting at age 76 if you are at high risk. Talk with your health care provider about your test results, treatment options, and if necessary, the need for more tests. Vaccines  Your health care provider may recommend certain vaccines, such as:  Influenza vaccine. This is recommended every year.  Tetanus, diphtheria, and acellular pertussis (Tdap,  Td) vaccine. You may need a Td booster every 10 years.  Zoster vaccine. You may need this after age 71.  Pneumococcal 13-valent conjugate (PCV13) vaccine. One dose is recommended after age 71.  Pneumococcal polysaccharide (PPSV23) vaccine. One dose is recommended after age 71. Talk to your health care provider about which screenings and vaccines you need and how often you need them. This information is not intended to replace advice given to you by your health care provider. Make sure you discuss any questions you have with your health care provider. Document Released: 08/14/2015 Document Revised: 04/06/2016 Document Reviewed: 05/19/2015 Elsevier Interactive Patient Education  2017 [place] Prevention in the Home Falls can cause injuries. They can happen to people of all ages. There are many things you can do to make your home safe and to help prevent falls. What can I do on the outside of my home?  Regularly fix the edges of walkways and driveways and fix any cracks.  Remove anything that might make you trip as you walk through a door, such as a raised step or threshold.  Trim any bushes or trees on the path to your home.  Use bright outdoor lighting.  Clear any walking paths of anything that might make someone trip, such as rocks or tools.  Regularly check to see if handrails are loose or broken. Make sure that both sides of any steps have handrails.  Any raised decks and porches should have guardrails on the edges.  Have any leaves, snow, or ice cleared regularly.  Use sand or salt on walking paths during winter.  Clean up any spills in your garage right away. This includes oil or grease spills. What can I do in the bathroom?  Use night lights.  Install grab bars by the toilet and in the tub and shower. Do not use towel bars as grab bars.  Use non-skid mats or decals in the tub or shower.  If you need to sit down in the shower, use a plastic, non-slip stool.  Keep the floor dry. Clean up any water that spills on the floor as soon as it happens.  Remove soap buildup in the tub or shower regularly.  Attach bath mats securely with double-sided  non-slip rug tape.  Do not have throw rugs and other things on the floor that can make you trip. What can I do in the bedroom?  Use night lights.  Make sure that you have a light by your bed that is easy to reach.  Do not use any sheets or blankets that are too big for your bed. They should not hang down onto the floor.  Have a firm chair that has side arms. You can use this for support while you get dressed.  Do not have throw rugs and other things on the floor that can make you trip. What can I do in the kitchen?  Clean up any spills right away.  Avoid walking on wet floors.  Keep items that you use a lot in easy-to-reach places.  If you need to reach something above you, use a strong step stool that has a grab bar.  Keep electrical cords out of the way.  Do not use floor polish or wax that makes floors slippery. If you must use wax, use non-skid floor wax.  Do not have throw rugs and other things on the floor that can make you trip. What can I do with my stairs?  Do not  leave any items on the stairs.  Make sure that there are handrails on both sides of the stairs and use them. Fix handrails that are broken or loose. Make sure that handrails are as long as the stairways.  Check any carpeting to make sure that it is firmly attached to the stairs. Fix any carpet that is loose or worn.  Avoid having throw rugs at the top or bottom of the stairs. If you do have throw rugs, attach them to the floor with carpet tape.  Make sure that you have a light switch at the top of the stairs and the bottom of the stairs. If you do not have them, ask someone to add them for you. What else can I do to help prevent falls?  Wear shoes that:  Do not have high heels.  Have rubber bottoms.  Are comfortable and fit you well.  Are closed at the toe. Do not wear sandals.  If you use a stepladder:  Make sure that it is fully opened. Do not climb a closed stepladder.  Make sure that both  sides of the stepladder are locked into place.  Ask someone to hold it for you, if possible.  Clearly mark and make sure that you can see:  Any grab bars or handrails.  First and last steps.  Where the edge of each step is.  Use tools that help you move around (mobility aids) if they are needed. These include:  Canes.  Walkers.  Scooters.  Crutches.  Turn on the lights when you go into a dark area. Replace any light bulbs as soon as they burn out.  Set up your furniture so you have a clear path. Avoid moving your furniture around.  If any of your floors are uneven, fix them.  If there are any pets around you, be aware of where they are.  Review your medicines with your doctor. Some medicines can make you feel dizzy. This can increase your chance of falling. Ask your doctor what other things that you can do to help prevent falls. This information is not intended to replace advice given to you by your health care provider. Make sure you discuss any questions you have with your health care provider. Document Released: 05/14/2009 Document Revised: 12/24/2015 Document Reviewed: 08/22/2014 Elsevier Interactive Patient Education  2017 [employer].

## 2020-06-05 ENCOUNTER — Other Ambulatory Visit: Payer: Self-pay

## 2020-06-05 ENCOUNTER — Encounter: Payer: Self-pay | Admitting: Nurse Practitioner

## 2020-06-05 ENCOUNTER — Ambulatory Visit (INDEPENDENT_AMBULATORY_CARE_PROVIDER_SITE_OTHER): Payer: Medicare Other | Admitting: Nurse Practitioner

## 2020-06-05 VITALS — BP 126/80 | HR 94 | Temp 98.5°F | Ht 62.13 in | Wt 148.0 lb

## 2020-06-05 DIAGNOSIS — Z1211 Encounter for screening for malignant neoplasm of colon: Secondary | ICD-10-CM

## 2020-06-05 DIAGNOSIS — Z23 Encounter for immunization: Secondary | ICD-10-CM

## 2020-06-05 DIAGNOSIS — E038 Other specified hypothyroidism: Secondary | ICD-10-CM

## 2020-06-05 DIAGNOSIS — Z8619 Personal history of other infectious and parasitic diseases: Secondary | ICD-10-CM | POA: Diagnosis not present

## 2020-06-05 DIAGNOSIS — Z0000 Encounter for general adult medical examination without abnormal findings: Secondary | ICD-10-CM

## 2020-06-05 DIAGNOSIS — R7309 Other abnormal glucose: Secondary | ICD-10-CM | POA: Diagnosis not present

## 2020-06-05 DIAGNOSIS — I10 Essential (primary) hypertension: Secondary | ICD-10-CM

## 2020-06-05 DIAGNOSIS — E221 Hyperprolactinemia: Secondary | ICD-10-CM

## 2020-06-05 DIAGNOSIS — N400 Enlarged prostate without lower urinary tract symptoms: Secondary | ICD-10-CM | POA: Diagnosis not present

## 2020-06-05 MED ORDER — AMLODIPINE BESYLATE 5 MG PO TABS: 5.0000 mg | ORAL_TABLET | Freq: Every day | ORAL | 4 refills | Status: DC

## 2020-06-05 MED ORDER — BENAZEPRIL HCL 40 MG PO TABS: 40.0000 mg | ORAL_TABLET | Freq: Every day | ORAL | 4 refills | Status: DC

## 2020-06-05 NOTE — Patient Instructions (Signed)
DASH Eating Plan DASH stands for "Dietary Approaches to Stop Hypertension." The DASH eating plan is a healthy eating plan that has been shown to reduce high blood pressure (hypertension). It may also reduce your risk for type 2 diabetes, heart disease, and stroke. The DASH eating plan may also help with weight loss. What are tips for following this plan?  General guidelines  Avoid eating more than 2,300 mg (milligrams) of salt (sodium) a day. If you have hypertension, you may need to reduce your sodium intake to 1,500 mg a day.  Limit alcohol intake to no more than 1 drink a day for nonpregnant women and 2 drinks a day for men. One drink equals 12 oz of beer, 5 oz of wine, or 1 oz of hard liquor.  Work with your health care provider to maintain a healthy body weight or to lose weight. Ask what an ideal weight is for you.  Get at least 30 minutes of exercise that causes your heart to beat faster (aerobic exercise) most days of the week. Activities may include walking, swimming, or biking.  Work with your health care provider or diet and nutrition specialist (dietitian) to adjust your eating plan to your individual calorie needs. Reading food labels   Check food labels for the amount of sodium per serving. Choose foods with less than 5 percent of the Daily Value of sodium. Generally, foods with less than 300 mg of sodium per serving fit into this eating plan.  To find whole grains, look for the word "whole" as the first word in the ingredient list. Shopping  Buy products labeled as "low-sodium" or "no salt added."  Buy fresh foods. Avoid canned foods and premade or frozen meals. Cooking  Avoid adding salt when cooking. Use salt-free seasonings or herbs instead of table salt or sea salt. Check with your health care provider or pharmacist before using salt substitutes.  Do not fry foods. Cook foods using healthy methods such as baking, boiling, grilling, and broiling instead.  Cook with  heart-healthy oils, such as olive, canola, soybean, or sunflower oil. Meal planning  Eat a balanced diet that includes: ? 5 or more servings of fruits and vegetables each day. At each meal, try to fill half of your plate with fruits and vegetables. ? Up to 6-8 servings of whole grains each day. ? Less than 6 oz of lean meat, poultry, or fish each day. A 3-oz serving of meat is about the same size as a deck of cards. One egg equals 1 oz. ? 2 servings of low-fat dairy each day. ? A serving of nuts, seeds, or beans 5 times each week. ? Heart-healthy fats. Healthy fats called Omega-3 fatty acids are found in foods such as flaxseeds and coldwater fish, like sardines, salmon, and mackerel.  Limit how much you eat of the following: ? Canned or prepackaged foods. ? Food that is high in trans fat, such as fried foods. ? Food that is high in saturated fat, such as fatty meat. ? Sweets, desserts, sugary drinks, and other foods with added sugar. ? Full-fat dairy products.  Do not salt foods before eating.  Try to eat at least 2 vegetarian meals each week.  Eat more home-cooked food and less restaurant, buffet, and fast food.  When eating at a restaurant, ask that your food be prepared with less salt or no salt, if possible. What foods are recommended? The items listed may not be a complete list. Talk with your dietitian about   what dietary choices are best for you. Grains Whole-grain or whole-wheat bread. Whole-grain or whole-wheat pasta. Brown rice. Oatmeal. Quinoa. Bulgur. Whole-grain and low-sodium cereals. Pita bread. Low-fat, low-sodium crackers. Whole-wheat flour tortillas. Vegetables Fresh or frozen vegetables (raw, steamed, roasted, or grilled). Low-sodium or reduced-sodium tomato and vegetable juice. Low-sodium or reduced-sodium tomato sauce and tomato paste. Low-sodium or reduced-sodium canned vegetables. Fruits All fresh, dried, or frozen fruit. Canned fruit in natural juice (without  added sugar). Meat and other protein foods Skinless chicken or turkey. Ground chicken or turkey. Pork with fat trimmed off. Fish and seafood. Egg whites. Dried beans, peas, or lentils. Unsalted nuts, nut butters, and seeds. Unsalted canned beans. Lean cuts of beef with fat trimmed off. Low-sodium, lean deli meat. Dairy Low-fat (1%) or fat-free (skim) milk. Fat-free, low-fat, or reduced-fat cheeses. Nonfat, low-sodium ricotta or cottage cheese. Low-fat or nonfat yogurt. Low-fat, low-sodium cheese. Fats and oils Soft margarine without trans fats. Vegetable oil. Low-fat, reduced-fat, or light mayonnaise and salad dressings (reduced-sodium). Canola, safflower, olive, soybean, and sunflower oils. Avocado. Seasoning and other foods Herbs. Spices. Seasoning mixes without salt. Unsalted popcorn and pretzels. Fat-free sweets. What foods are not recommended? The items listed may not be a complete list. Talk with your dietitian about what dietary choices are best for you. Grains Baked goods made with fat, such as croissants, muffins, or some breads. Dry pasta or rice meal packs. Vegetables Creamed or fried vegetables. Vegetables in a cheese sauce. Regular canned vegetables (not low-sodium or reduced-sodium). Regular canned tomato sauce and paste (not low-sodium or reduced-sodium). Regular tomato and vegetable juice (not low-sodium or reduced-sodium). Pickles. Olives. Fruits Canned fruit in a light or heavy syrup. Fried fruit. Fruit in cream or butter sauce. Meat and other protein foods Fatty cuts of meat. Ribs. Fried meat. Bacon. Sausage. Bologna and other processed lunch meats. Salami. Fatback. Hotdogs. Bratwurst. Salted nuts and seeds. Canned beans with added salt. Canned or smoked fish. Whole eggs or egg yolks. Chicken or turkey with skin. Dairy Whole or 2% milk, cream, and half-and-half. Whole or full-fat cream cheese. Whole-fat or sweetened yogurt. Full-fat cheese. Nondairy creamers. Whipped toppings.  Processed cheese and cheese spreads. Fats and oils Butter. Stick margarine. Lard. Shortening. Ghee. Bacon fat. Tropical oils, such as coconut, palm kernel, or palm oil. Seasoning and other foods Salted popcorn and pretzels. Onion salt, garlic salt, seasoned salt, table salt, and sea salt. Worcestershire sauce. Tartar sauce. Barbecue sauce. Teriyaki sauce. Soy sauce, including reduced-sodium. Steak sauce. Canned and packaged gravies. Fish sauce. Oyster sauce. Cocktail sauce. Horseradish that you find on the shelf. Ketchup. Mustard. Meat flavorings and tenderizers. Bouillon cubes. Hot sauce and Tabasco sauce. Premade or packaged marinades. Premade or packaged taco seasonings. Relishes. Regular salad dressings. Where to find more information:  National Heart, Lung, and Blood Institute: www.nhlbi.nih.gov  American Heart Association: www.heart.org Summary  The DASH eating plan is a healthy eating plan that has been shown to reduce high blood pressure (hypertension). It may also reduce your risk for type 2 diabetes, heart disease, and stroke.  With the DASH eating plan, you should limit salt (sodium) intake to 2,300 mg a day. If you have hypertension, you may need to reduce your sodium intake to 1,500 mg a day.  When on the DASH eating plan, aim to eat more fresh fruits and vegetables, whole grains, lean proteins, low-fat dairy, and heart-healthy fats.  Work with your health care provider or diet and nutrition specialist (dietitian) to adjust your eating plan to your   individual calorie needs. This information is not intended to replace advice given to you by your health care provider. Make sure you discuss any questions you have with your health care provider. Document Revised: 06/30/2017 Document Reviewed: 07/11/2016 Elsevier Patient Education  2020 [employer] Inc.  

## 2020-06-05 NOTE — Assessment & Plan Note (Addendum)
No current symptoms or medication.  PSA today, patients wishes to continue screening although age >24.

## 2020-06-05 NOTE — Progress Notes (Signed)
BP 126/80   Pulse 94   Temp 98.5 F (36.9 C) (Oral)   Ht 5' 2.13" (1.578 m)   Wt 148 lb (67.1 kg)   SpO2 98%   BMI 26.96 kg/m    Subjective:    Patient ID: Jerry Frey, male    DOB: October 18, 1950, 69 y.o.   MRN: [id number]  HPI: Wyland Rastetter is a 69 y.o. male presenting on 06/05/2020 for comprehensive medical examination. Current medical complaints include:none  He currently lives with: wife Interim Problems from his last visit: no   HYPERTENSION Continues on Amlodipine and Benazepril.   Hypertension status: stable  Satisfied with current treatment? yes Duration of hypertension: chronic BP monitoring frequency:  a few times a week BP range: 120-130/70 BP medication side effects:  no Medication compliance: good compliance Aspirin: no Recurrent headaches: no Visual changes: no Palpitations: no Dyspnea: no Chest pain: no Lower extremity edema: no Dizzy/lightheaded: no   HYPOTHYROIDISM Followed by Dr. Honor Junes with endocrinology for hypothyroid and hyperprolactinemia and last saw 08/29/2019.  Has labs yearly and January 2021 -- TSH 0.80. Thyroid control status:stable Satisfied with current treatment? yes Medication side effects: no Medication compliance: good compliance Etiology of hypothyroidism:  Recent dose adjustment:no Fatigue: no Cold intolerance: no Heat intolerance: no Weight gain: no Weight loss: no Constipation: no Diarrhea/loose stools: no Palpitations: no Lower extremity edema: no Anxiety/depressed mood: no  Functional Status Survey: Is the patient deaf or have difficulty hearing?: No Does the patient have difficulty seeing, even when wearing glasses/contacts?: No Does the patient have difficulty concentrating, remembering, or making decisions?: No Does the patient have difficulty walking or climbing stairs?: No Does the patient have difficulty dressing or bathing?: No Does the patient have difficulty doing errands alone such as  visiting a doctor's office or shopping?: No  FALL RISK: Fall Risk  05/29/2020 05/27/2019 05/23/2018 11/28/2017 05/03/2017  Falls in the past year? 0 0 No No No  Number falls in past yr: - 0 - - -  Injury with Fall? - 0 - - -  Risk for fall due to : Medication side effect - - - -  Follow up Falls evaluation completed;Education provided;Falls prevention discussed - - - -    Depression Screen Depression screen [facility] 2/9 05/29/2020 05/27/2019 05/23/2018 11/28/2017 05/03/2017  Decreased Interest 0 0 0 0 0  Down, Depressed, Hopeless 0 0 0 0 0  PHQ - 2 Score 0 0 0 0 0  Altered sleeping 0 - - - -  Tired, decreased energy 0 - - - -  Change in appetite 0 - - - -  Feeling bad or failure about yourself  0 - - - -  Trouble concentrating 0 - - - -  Moving slowly or fidgety/restless 0 - - - -  Suicidal thoughts 0 - - - -  PHQ-9 Score 0 - - - -  Difficult doing work/chores Not difficult at all - - - -    Advanced Directives <no information>  Past Medical History:  Past Medical History:  Diagnosis Date  . Hypertension   . Thyroid disease     Surgical History:  Past Surgical History:  Procedure Laterality Date  . L4 compression fracture      Medications:  Current Outpatient Medications on File Prior to Visit  Medication Sig  . levothyroxine (SYNTHROID, LEVOTHROID) 100 MCG tablet Take 1 tablet (100 mcg total) by mouth daily before breakfast.   No current facility-administered medications on file prior to visit.  Allergies:  No Known Allergies  Social History:  Social History   Socioeconomic History  . Marital status: Married    Spouse name: Not on file  . Number of children: Not on file  . Years of education: Not on file  . Highest education level: 12th grade  Occupational History  . Occupation: retired  Tobacco Use  . Smoking status: Never Smoker  . Smokeless tobacco: Never Used  Vaping Use  . Vaping Use: Never used  Substance and Sexual Activity  . Alcohol use: Not  Currently    Alcohol/week: 7.0 standard drinks    Types: 7 Glasses of wine per week    Comment: per week  . Drug use: No  . Sexual activity: Not on file  Other Topics Concern  . Not on file  Social History Narrative  . Not on file   Social Determinants of Health   Financial Resource Strain: Low Risk   . Difficulty of Paying Living Expenses: Not hard at all  Food Insecurity: No Food Insecurity  . Worried About Charity fundraiser in the Last Year: Never true  . Ran Out of Food in the Last Year: Never true  Transportation Needs: No Transportation Needs  . Lack of Transportation (Medical): No  . Lack of Transportation (Non-Medical): No  Physical Activity: Inactive  . Days of Exercise per Week: 0 days  . Minutes of Exercise per Session: 0 min  Stress: No Stress Concern Present  . Feeling of Stress : Not at all  Social Connections:   . Frequency of Communication with Friends and Family: Not on file  . Frequency of Social Gatherings with Friends and Family: Not on file  . Attends Religious Services: Not on file  . Active Member of Clubs or Organizations: Not on file  . Attends Archivist Meetings: Not on file  . Marital Status: Not on file  Intimate Partner Violence:   . Fear of Current or Ex-Partner: Not on file  . Emotionally Abused: Not on file  . Physically Abused: Not on file  . Sexually Abused: Not on file   Social History   Tobacco Use  Smoking Status Never Smoker  Smokeless Tobacco Never Used   Social History   Substance and Sexual Activity  Alcohol Use Not Currently  . Alcohol/week: 7.0 standard drinks  . Types: 7 Glasses of wine per week   Comment: per week    Family History:  Family History  Family history unknown: Yes    Past medical history, surgical history, medications, allergies, family history and social history reviewed with patient today and changes made to appropriate areas of the chart.   Review of Systems - negative All other ROS  negative except what is listed above and in the HPI.      Objective:    BP 126/80   Pulse 94   Temp 98.5 F (36.9 C) (Oral)   Ht 5' 2.13" (1.578 m)   Wt 148 lb (67.1 kg)   SpO2 98%   BMI 26.96 kg/m   Wt Readings from Last 3 Encounters:  06/05/20 148 lb (67.1 kg)  05/29/20 146 lb (66.2 kg)  05/27/19 143 lb (64.9 kg)    Physical Exam Vitals and nursing note reviewed.  Constitutional:      General: He is awake. He is not in acute distress.    Appearance: He is well-developed, well-groomed and overweight. He is not ill-appearing.  HENT:     Head: Normocephalic and atraumatic.  Right Ear: Hearing, tympanic membrane, ear canal and external ear normal. No drainage.     Left Ear: Hearing, tympanic membrane, ear canal and external ear normal. No drainage.     Nose: Nose normal.     Mouth/Throat:     Pharynx: Uvula midline.  Eyes:     General: Lids are normal.        Right eye: No discharge.        Left eye: No discharge.     Extraocular Movements: Extraocular movements intact.     Conjunctiva/sclera: Conjunctivae normal.     Pupils: Pupils are equal, round, and reactive to light.     Visual Fields: Right eye visual fields normal and left eye visual fields normal.  Neck:     Thyroid: No thyromegaly.     Vascular: No carotid bruit or JVD.     Trachea: Trachea normal.  Cardiovascular:     Rate and Rhythm: Normal rate and regular rhythm.     Heart sounds: Normal heart sounds, S1 normal and S2 normal. No murmur heard.  No gallop.   Pulmonary:     Effort: Pulmonary effort is normal. No accessory muscle usage or respiratory distress.     Breath sounds: Normal breath sounds.  Abdominal:     General: Bowel sounds are normal.     Palpations: Abdomen is soft. There is no hepatomegaly or splenomegaly.     Tenderness: There is no abdominal tenderness.  Musculoskeletal:        General: Normal range of motion.     Cervical back: Normal range of motion and neck supple.     Right  lower leg: No edema.     Left lower leg: No edema.  Lymphadenopathy:     Head:     Right side of head: No submental, submandibular, tonsillar, preauricular or posterior auricular adenopathy.     Left side of head: No submental, submandibular, tonsillar, preauricular or posterior auricular adenopathy.     Cervical: No cervical adenopathy.  Skin:    General: Skin is warm and dry.     Capillary Refill: Capillary refill takes less than 2 seconds.     Findings: No rash.  Neurological:     Mental Status: He is alert and oriented to person, place, and time.     Cranial Nerves: Cranial nerves are intact.     Gait: Gait is intact.     Deep Tendon Reflexes: Reflexes are normal and symmetric.     Reflex Scores:      Brachioradialis reflexes are 2+ on the right side and 2+ on the left side.      Patellar reflexes are 2+ on the right side and 2+ on the left side. Psychiatric:        Attention and Perception: Attention normal.        Mood and Affect: Mood normal.        Speech: Speech normal.        Behavior: Behavior normal. Behavior is cooperative.        Thought Content: Thought content normal.        Cognition and Memory: Cognition normal.        Judgment: Judgment normal.    Results for orders placed or performed in visit on 05/30/18  Microscopic Examination   BLD HERE  Result Value Ref Range   WBC, UA 0-5 0 - 5 /hpf   RBC, UA 0-2 0 - 2 /hpf   Epithelial Cells (non renal) 0-10  0 - 10 /hpf   Crystals Present N/A   Crystal Type Calcium Oxalate N/A   Bacteria, UA None seen None seen/Few  Bayer DCA Hb A1c Waived  Result Value Ref Range   HB A1C (BAYER DCA - WAIVED) 5.4 <7.0 %  Comprehensive metabolic panel  Result Value Ref Range   Glucose 137 (H) 65 - 99 mg/dL   BUN 22 8 - 27 mg/dL   Creatinine, Ser 0.83 0.76 - 1.27 mg/dL   GFR calc non Af Amer 90 >59 mL/min/1.73   GFR calc Af Amer 104 >59 mL/min/1.73   BUN/Creatinine Ratio 27 (H) 10 - 24   Sodium 143 134 - 144 mmol/L    Potassium 3.9 3.5 - 5.2 mmol/L   Chloride 107 (H) 96 - 106 mmol/L   CO2 21 20 - 29 mmol/L   Calcium 8.9 8.6 - 10.2 mg/dL   Total Protein 6.6 6.0 - 8.5 g/dL   Albumin 3.9 3.6 - 4.8 g/dL   Globulin, Total 2.7 1.5 - 4.5 g/dL   Albumin/Globulin Ratio 1.4 1.2 - 2.2   Bilirubin Total 0.4 0.0 - 1.2 mg/dL   Alkaline Phosphatase 51 39 - 117 IU/L   AST 29 0 - 40 IU/L   ALT 28 0 - 44 IU/L  Lipid panel  Result Value Ref Range   Cholesterol, Total 208 (H) 100 - 199 mg/dL   Triglycerides 296 (H) 0 - 149 mg/dL   HDL 50 >39 mg/dL   VLDL Cholesterol Cal 59 (H) 5 - 40 mg/dL   LDL Calculated 99 0 - 99 mg/dL   Chol/HDL Ratio 4.2 0.0 - 5.0 ratio  CBC with Differential/Platelet  Result Value Ref Range   WBC 5.4 3.4 - 10.8 x10E3/uL   RBC 4.63 4.14 - 5.80 x10E6/uL   Hemoglobin 14.1 13.0 - 17.7 g/dL   Hematocrit 42.2 37.5 - 51.0 %   MCV 91 79 - 97 fL   MCH 30.5 26.6 - 33.0 pg   MCHC 33.4 31 - 35 g/dL   RDW 14.0 12.3 - 15.4 %   Platelets 188 150 - 450 x10E3/uL   Neutrophils 53 Not Estab. %   Lymphs 26 Not Estab. %   Monocytes 11 Not Estab. %   Eos 9 Not Estab. %   Basos 1 Not Estab. %   Neutrophils Absolute 2.9 1.40 - 7.00 x10E3/uL   Lymphocytes Absolute 1.4 0 - 3 x10E3/uL   Monocytes Absolute 0.6 0 - 0 x10E3/uL   EOS (ABSOLUTE) 0.5 (H) 0.0 - 0.4 x10E3/uL   Basophils Absolute 0.1 0 - 0 x10E3/uL   Immature Granulocytes 0 Not Estab. %   Immature Grans (Abs) 0.0 0.0 - 0.1 x10E3/uL  TSH  Result Value Ref Range   TSH <0.006 (L) 0.450 - 4.500 uIU/mL  Urinalysis, Routine w reflex microscopic  Result Value Ref Range   Specific Gravity, UA 1.015 1.005 - 1.030   pH, UA 5.5 5.0 - 7.5   Color, UA Yellow Yellow   Appearance Ur Clear Clear   Leukocytes, UA Negative Negative   Protein, UA Negative Negative/Trace   Glucose, UA Negative Negative   Ketones, UA Negative Negative   RBC, UA Trace (A) Negative   Bilirubin, UA Negative Negative   Urobilinogen, Ur 0.2 0.2 - 1.0 mg/dL   Nitrite, UA Negative  Negative   Microscopic Examination See below:   PSA  Result Value Ref Range   Prostate Specific Ag, Serum 0.6 0.0 - 4.0 ng/mL  Assessment & Plan:   Problem List Items Addressed This Visit      Cardiovascular and Mediastinum   Hypertension    Chronic, stable with BP at goal in office and on occasional home readings.  Continue current medication regimen and adjust as needed.  Recommend he continue to monitor BP at home daily and document for provider + focus on DASH diet.  CMP and TSH today.  Refills sent in.  Return to office in 6 months to meet new provider.      Relevant Medications   amLODipine (NORVASC) 5 MG tablet   benazepril (LOTENSIN) 40 MG tablet   Other Relevant Orders   CBC with Differential/Platelet   Lipid Panel w/o Chol/HDL Ratio     Endocrine   Central hypothyroidism    Chronic, ongoing.  Followed by endocrinology.  Yearly levels with them.  Continue to collaborate with endo and current medication regimen as prescribed by them.  Return to office in 6 months to meet new provider.      Relevant Orders   CBC with Differential/Platelet   Hyperprolactinemia ([place]) - Primary    Followed by endocrinology.  Continue this collaboration, yearly note reviewed.        Relevant Orders   CBC with Differential/Platelet   Comprehensive metabolic panel     Genitourinary   BPH (benign prostatic hyperplasia)    No current symptoms or medication.  PSA today, patients wishes to continue screening although age >37.      Relevant Orders   PSA     Other   Elevated glucose    Noted to be 132 on last labs.  Check A1C today.      Relevant Orders   HgB A1c   History of hepatitis B    Previously followed by GI.  Referral placed to update colonoscopy.  CMP today.      Relevant Orders   Comprehensive metabolic panel    Other Visit Diagnoses    Colon cancer screening       Referral to GI placed   Relevant Orders   Ambulatory referral to Gastroenterology   Need for  influenza vaccination       Flu vaccine today   Relevant Orders   Flu Vaccine QUAD High Dose(Fluad) (Completed)   Annual physical exam          Discussed aspirin prophylaxis for myocardial infarction prevention and decision was that we recommended ASA, and patient refused  LABORATORY TESTING:  Health maintenance labs ordered today as discussed above.   The natural history of prostate cancer and ongoing controversy regarding screening and potential treatment outcomes of prostate cancer has been discussed with the patient. The meaning of a false positive PSA and a false negative PSA has been discussed. He indicates understanding of the limitations of this screening test and wishes to proceed with screening PSA testing.   IMMUNIZATIONS:   - Tdap: Tetanus vaccination status reviewed: last tetanus booster within 10 years. - Influenza: Up to date - Pneumovax: Up to date - Prevnar: Up to date - Zostavax vaccine: Up to date  SCREENING: - Colonoscopy: Ordered today  Discussed with patient purpose of the colonoscopy is to detect colon cancer at curable precancerous or early stages   - AAA Screening: Not applicable  -Hearing Test: Not applicable  -Spirometry: Not applicable   PATIENT COUNSELING:    Sexuality: Discussed sexually transmitted diseases, partner selection, use of condoms, avoidance of unintended pregnancy  and contraceptive alternatives.   Advised to  avoid cigarette smoking.  I discussed with the patient that most people either abstain from alcohol or drink within safe limits (<=14/week and <=4 drinks/occasion for males, <=7/weeks and <= 3 drinks/occasion for females) and that the risk for alcohol disorders and other health effects rises proportionally with the number of drinks per week and how often a drinker exceeds daily limits.  Discussed cessation/primary prevention of drug use and availability of treatment for abuse.   Diet: Encouraged to adjust caloric intake to  maintain  or achieve ideal body weight, to reduce intake of dietary saturated fat and total fat, to limit sodium intake by avoiding high sodium foods and not adding table salt, and to maintain adequate dietary potassium and calcium preferably from fresh fruits, vegetables, and low-fat dairy products.    Stressed the importance of regular exercise  Injury prevention: Discussed safety belts, safety helmets, smoke detector, smoking near bedding or upholstery.   Dental health: Discussed importance of regular tooth brushing, flossing, and dental visits.   Follow up plan: NEXT PREVENTATIVE PHYSICAL DUE IN 1 YEAR. Return in about 6 months (around 12/03/2020) for HTN --- meet new physician in practice.

## 2020-06-05 NOTE — Assessment & Plan Note (Signed)
Chronic, ongoing.  Followed by endocrinology.  Yearly levels with them.  Continue to collaborate with endo and current medication regimen as prescribed by them.  Return to office in 6 months to meet new provider.

## 2020-06-05 NOTE — Assessment & Plan Note (Signed)
Noted to be 132 on last labs.  Check A1C today.

## 2020-06-05 NOTE — Assessment & Plan Note (Addendum)
Chronic, stable with BP at goal in office and on occasional home readings.  Continue current medication regimen and adjust as needed.  Recommend he continue to monitor BP at home daily and document for provider + focus on DASH diet.  CMP and TSH today.  Refills sent in.  Return to office in 6 months to meet new provider.

## 2020-06-05 NOTE — Assessment & Plan Note (Signed)
Followed by endocrinology.  Continue this collaboration, yearly note reviewed.

## 2020-06-05 NOTE — Assessment & Plan Note (Signed)
Previously followed by GI.  Referral placed to update colonoscopy.  CMP today.

## 2020-06-06 ENCOUNTER — Other Ambulatory Visit: Payer: Self-pay | Admitting: Family Medicine

## 2020-06-06 DIAGNOSIS — I10 Essential (primary) hypertension: Secondary | ICD-10-CM

## 2020-06-06 LAB — COMPREHENSIVE METABOLIC PANEL
ALT: 52 IU/L — ABNORMAL HIGH (ref 0–44)
AST: 65 IU/L — ABNORMAL HIGH (ref 0–40)
Albumin/Globulin Ratio: 1.6 (ref 1.2–2.2)
Albumin: 4.7 g/dL (ref 3.7–4.7)
Alkaline Phosphatase: 59 IU/L (ref 44–121)
BUN/Creatinine Ratio: 17 (ref 10–24)
BUN: 13 mg/dL (ref 8–27)
Bilirubin Total: 0.4 mg/dL (ref 0.0–1.2)
CO2: 23 mmol/L (ref 20–29)
Calcium: 9.3 mg/dL (ref 8.6–10.2)
Chloride: 101 mmol/L (ref 96–106)
Creatinine, Ser: 0.75 mg/dL — ABNORMAL LOW (ref 0.76–1.27)
GFR calc Af Amer: 107 mL/min/{1.73_m2} (ref >59)
GFR calc non Af Amer: 92 mL/min/{1.73_m2} (ref >59)
Globulin, Total: 2.9 g/dL (ref 1.5–4.5)
Glucose: 92 mg/dL (ref 65–99)
Potassium: 4 mmol/L (ref 3.5–5.2)
Sodium: 140 mmol/L (ref 134–144)
Total Protein: 7.6 g/dL (ref 6.0–8.5)

## 2020-06-06 LAB — CBC WITH DIFFERENTIAL/PLATELET
Basophils Absolute: 0 10*3/uL (ref 0.0–0.2)
Basos: 1 %
EOS (ABSOLUTE): 0.1 10*3/uL (ref 0.0–0.4)
Eos: 2 %
Hematocrit: 48 % (ref 37.5–51.0)
Hemoglobin: 15.5 g/dL (ref 13.0–17.7)
Immature Grans (Abs): 0 10*3/uL (ref 0.0–0.1)
Immature Granulocytes: 0 %
Lymphocytes Absolute: 1.3 10*3/uL (ref 0.7–3.1)
Lymphs: 22 %
MCH: 29.6 pg (ref 26.6–33.0)
MCHC: 32.3 g/dL (ref 31.5–35.7)
MCV: 92 fL (ref 79–97)
Monocytes Absolute: 0.5 10*3/uL (ref 0.1–0.9)
Monocytes: 8 %
Neutrophils Absolute: 3.8 10*3/uL (ref 1.4–7.0)
Neutrophils: 67 %
Platelets: 170 10*3/uL (ref 150–450)
RBC: 5.23 x10E6/uL (ref 4.14–5.80)
RDW: 13.3 % (ref 11.6–15.4)
WBC: 5.7 10*3/uL (ref 3.4–10.8)

## 2020-06-06 LAB — LIPID PANEL W/O CHOL/HDL RATIO
Cholesterol, Total: 195 mg/dL (ref 100–199)
HDL: 57 mg/dL (ref >39)
LDL Chol Calc (NIH): 79 mg/dL (ref 0–99)
Triglycerides: 367 mg/dL — ABNORMAL HIGH (ref 0–149)
VLDL Cholesterol Cal: 59 mg/dL — ABNORMAL HIGH (ref 5–40)

## 2020-06-06 LAB — PSA: Prostate Specific Ag, Serum: 0.8 ng/mL (ref 0.0–4.0)

## 2020-06-06 LAB — HEMOGLOBIN A1C
Est. average glucose Bld gHb Est-mCnc: 120 mg/dL
Hgb A1c MFr Bld: 5.8 % — ABNORMAL HIGH (ref 4.8–5.6)

## 2020-06-07 ENCOUNTER — Encounter: Payer: Self-pay | Admitting: Nurse Practitioner

## 2020-06-07 ENCOUNTER — Other Ambulatory Visit: Payer: Self-pay | Admitting: Nurse Practitioner

## 2020-06-07 DIAGNOSIS — Z8619 Personal history of other infectious and parasitic diseases: Secondary | ICD-10-CM

## 2020-06-07 DIAGNOSIS — R7301 Impaired fasting glucose: Secondary | ICD-10-CM

## 2020-06-07 DIAGNOSIS — R7989 Other specified abnormal findings of blood chemistry: Secondary | ICD-10-CM

## 2020-06-07 NOTE — Progress Notes (Signed)
Letter out for lab results and requested lab visit only in 3 months, please call him to schedule this.  To recheck liver function tests which were mildly elevated and his diabetes testing which was prediabetic.  Thank you.

## 2020-06-08 ENCOUNTER — Telehealth: Payer: Self-pay

## 2020-06-08 NOTE — Telephone Encounter (Signed)
Called pt to schedule lab only appt in 3 month, no answer, left vm

## 2020-06-19 ENCOUNTER — Telehealth: Payer: Medicare Other

## 2020-07-13 ENCOUNTER — Other Ambulatory Visit: Payer: Self-pay

## 2020-07-15 DIAGNOSIS — Z23 Encounter for immunization: Secondary | ICD-10-CM | POA: Diagnosis not present

## 2020-07-17 ENCOUNTER — Ambulatory Visit (INDEPENDENT_AMBULATORY_CARE_PROVIDER_SITE_OTHER): Payer: Medicare Other | Admitting: Gastroenterology

## 2020-07-17 ENCOUNTER — Other Ambulatory Visit: Payer: Self-pay

## 2020-07-17 ENCOUNTER — Encounter (INDEPENDENT_AMBULATORY_CARE_PROVIDER_SITE_OTHER): Payer: Self-pay

## 2020-07-17 ENCOUNTER — Encounter: Payer: Self-pay | Admitting: Gastroenterology

## 2020-07-17 VITALS — BP 151/99 | HR 85 | Temp 97.9°F | Ht 62.0 in | Wt 151.5 lb

## 2020-07-17 DIAGNOSIS — B181 Chronic viral hepatitis B without delta-agent: Secondary | ICD-10-CM

## 2020-07-17 DIAGNOSIS — Z7251 High risk heterosexual behavior: Secondary | ICD-10-CM

## 2020-07-17 DIAGNOSIS — Z8619 Personal history of other infectious and parasitic diseases: Secondary | ICD-10-CM

## 2020-07-17 DIAGNOSIS — Z1211 Encounter for screening for malignant neoplasm of colon: Secondary | ICD-10-CM

## 2020-07-17 MED ORDER — NA SULFATE-K SULFATE-MG SULF 17.5-3.13-1.6 GM/177ML PO SOLN: 354.0000 mL | Freq: Once | ORAL | 0 refills | Status: AC

## 2020-07-17 NOTE — Progress Notes (Signed)
Cephas Darby, MD [address]  [place]  [place], [place] [address]  Main: [phone]  Fax: [phone]    Gastroenterology Consultation  Referring Provider:     Venita Lick, NP Primary Care Physician:  Guadalupe Maple, MD Primary Gastroenterologist:  Dr. Cephas Darby Reason for Consultation:   Colon cancer screening, chronic Hep B         HPI:   Jerry Frey is a 69 y.o. male from [place] referred by Dr. Guadalupe Maple, MD  for consultation & management of hemorrhoidal prolapse. He reports noticing soft tissue coming out every time he defecates and reduces spontaneously after defecation for the last 2-3 months. He otherwise denies bleeding, itching or pain, constipation, diarrhea. He denies weight loss or abdominal pain. He saw his primary care doctor who started him on hydrocortisone hemorrhoid cream which he thinks is helping. He still continues to feel the tissue coming out. He had a colonoscopy about 5 years ago at [facility] and reportedly normal. However, I could not find the colonoscopy report  Of note, he has mildly elevated ALT from recent labs. He has hepatitis C antibody negative.  Denies any other GI symptoms.  Follow-up visit 08/29/2017: Since last visit, patient reports that the hemorrhoid prolapse has improved with topical hydrocortisone. He still see some tissue coming out but not as much as before. Also, he does have chronic hepatitis B infection with low viral load. He is probably in the phase of seroconversion. His hepatitis B E antibody positive, E antigen negative, surface antigen positive, core antibody positive. Ultrasound liver was normal.  Follow-up visit 07/17/2020 Patient is here for follow-up of history of chronic hepatitis B with low viral load. He reports gaining weight since he is retired and denies any GI symptoms today. His most recent labs from November 2021 revealed mildly elevated transaminases AST/ALT 65/52 as well  as elevated serum triglycerides. He continues to drink 2 glasses of wine daily  NSAIDs: None  Antiplts/Anticoagulants/Anti thrombotics: None  Used to drink about 4-6 beers daily, currently drinks 2 glasses of wine daily Denies smoking Denies any GI surgeries Denies any family history of GI, liver conditions or malignancies GI Procedures: Colonoscopy about 5 years ago, reportedly normal per patient  Past Medical History:  Diagnosis Date  . Hypertension   . Thyroid disease     Past Surgical History:  Procedure Laterality Date  . L4 compression fracture       Current Outpatient Medications:  .  amLODipine (NORVASC) 5 MG tablet, Take 1 tablet (5 mg total) by mouth daily., Disp: 90 tablet, Rfl: 4 .  benazepril (LOTENSIN) 40 MG tablet, Take 1 tablet (40 mg total) by mouth daily., Disp: 90 tablet, Rfl: 4 .  levothyroxine (SYNTHROID) 125 MCG tablet, Take 125 mcg by mouth daily., Disp: , Rfl:  .  Na Sulfate-K Sulfate-Mg Sulf 17.5-3.13-1.6 GM/177ML SOLN, Take 354 mLs by mouth once for 1 dose., Disp: 354 mL, Rfl: 0   Family History  Family history unknown: Yes     Social History   Tobacco Use  . Smoking status: Never Smoker  . Smokeless tobacco: Never Used  Vaping Use  . Vaping Use: Never used  Substance Use Topics  . Alcohol use: Not Currently    Alcohol/week: 7.0 standard drinks    Types: 7 Glasses of wine per week    Comment: per week  . Drug use: No    Allergies as of 07/17/2020  . (No  Known Allergies)    Review of Systems:    All systems reviewed and negative except where noted in HPI.   Physical Exam:  BP (!) 151/99 (BP Location: Left Arm, Patient Position: Sitting, Cuff Size: Normal)   Pulse 85   Temp 97.9 F (36.6 C) (Oral)   Ht 5\' 2"  (1.575 m)   Wt 151 lb 8 oz (68.7 kg)   BMI 27.71 kg/m  No LMP for male patient.  General:   Alert,  Well-developed, well-nourished, pleasant and cooperative in NAD Head:  Normocephalic and atraumatic. Eyes:  Sclera  clear, no icterus.   Conjunctiva pink. Ears:  Normal auditory acuity. Nose:  No deformity, discharge, or lesions. Mouth:  No deformity or lesions,oropharynx pink & moist. Neck:  Supple; no masses or thyromegaly. Lungs:  Respirations even and unlabored.  Clear throughout to auscultation.   No wheezes, crackles, or rhonchi. No acute distress. Heart:  Regular rate and rhythm; no murmurs, clicks, rubs, or gallops. Abdomen:  Normal bowel sounds. Soft, mildly obese, non-tender and non-distended without masses, hepatosplenomegaly or hernias noted.  No guarding or rebound tenderness.   Rectal: No external hemorrhoids appreciated, normal perianal skin.  Msk:  Symmetrical without gross deformities. Good, equal movement & strength bilaterally. Pulses:  Normal pulses noted. Extremities:  No clubbing or edema.  No cyanosis. Neurologic:  Alert and oriented x3;  grossly normal neurologically. Skin:  Intact without significant lesions or rashes. No jaundice. Psych:  Alert and cooperative. Normal mood and affect.  Imaging Studies none  Assessment and Plan:   Jerry Frey is a 69 y.o. Asian male with history of grade 3 internal hemorrhoid and chronic hepatitis B infection here for follow-up.  Chronic hepatitis B infection with history of low viral load Elevated transaminases HCV antibody negative Recommend to check HIV, hep a antibody total Recheck HBV DNA, surface antigen, surface antibody, core antibody total, hepatitis delta antibody, E antigen, E antibody and LFTs Based on the above work-up, will determine if patient needs treatment for hepatitis B and also ultrasound Recheck LFTs every 3 months Strongly advised patient to avoid drinking alcohol completely  Follow up in 2-3 months  Colon cancer screening Recommend screening colonoscopy   Cephas Darby, MD

## 2020-07-18 LAB — HEPATIC FUNCTION PANEL
ALT: 57 IU/L — ABNORMAL HIGH (ref 0–44)
AST: 63 IU/L — ABNORMAL HIGH (ref 0–40)
Albumin: 5 g/dL — ABNORMAL HIGH (ref 3.7–4.7)
Alkaline Phosphatase: 54 IU/L (ref 44–121)
Bilirubin Total: 0.6 mg/dL (ref 0.0–1.2)
Bilirubin, Direct: 0.21 mg/dL (ref 0.00–0.40)
Total Protein: 7.6 g/dL (ref 6.0–8.5)

## 2020-07-18 LAB — HEPATITIS B SURFACE ANTIBODY,QUALITATIVE: Hep B Surface Ab, Qual: REACTIVE

## 2020-07-18 LAB — HEPATITIS B CORE ANTIBODY, TOTAL
Hep B Core Total Ab: POSITIVE — AB
Hep B Core Total Ab: POSITIVE — AB

## 2020-07-18 LAB — HEPATITIS B E ANTIGEN: Hep B E Ag: NEGATIVE

## 2020-07-18 LAB — HIV ANTIBODY (ROUTINE TESTING W REFLEX): HIV Screen 4th Generation wRfx: NONREACTIVE

## 2020-07-18 LAB — HEPATITIS A ANTIBODY, TOTAL: hep A Total Ab: POSITIVE — AB

## 2020-07-19 LAB — HEPATITIS B DNA, ULTRAQUANTITATIVE, PCR
HBV DNA SERPL PCR-ACNC: 20 IU/mL
HBV DNA SERPL PCR-LOG IU: 1.301 log10 IU/mL

## 2020-07-20 LAB — HEPATITIS B SURFACE ANTIGEN: Hepatitis B Surface Ag: NEGATIVE

## 2020-07-20 LAB — HEPATITIS B E ANTIBODY: Hep B E Ab: POSITIVE — AB

## 2020-07-21 ENCOUNTER — Telehealth: Payer: Self-pay | Admitting: Gastroenterology

## 2020-07-21 ENCOUNTER — Other Ambulatory Visit: Payer: Self-pay

## 2020-07-21 ENCOUNTER — Telehealth: Payer: Self-pay

## 2020-07-21 ENCOUNTER — Other Ambulatory Visit: Payer: Self-pay | Admitting: Gastroenterology

## 2020-07-21 DIAGNOSIS — B181 Chronic viral hepatitis B without delta-agent: Secondary | ICD-10-CM

## 2020-07-21 MED ORDER — CLENPIQ 10-3.5-12 MG-GM -GM/160ML PO SOLN
1.0000 | Freq: Once | ORAL | 0 refills | Status: AC
Start: 2020-07-21 — End: 2020-07-21

## 2020-07-21 NOTE — Telephone Encounter (Signed)
Returned patients call. LVM to call office back. 

## 2020-07-21 NOTE — Progress Notes (Signed)
Lab that was missing was order under [facility]. Juliann Pulse called and got the ordering numbers for [facility]. Patient will come get lab work done

## 2020-07-21 NOTE — Telephone Encounter (Signed)
-----   Message from Lin Landsman, MD sent at 07/21/2020  1:10 PM EST ----- Recommend ultrasound liver, chronic hepatitis B infection  RV

## 2020-07-21 NOTE — Telephone Encounter (Signed)
Sent Clenpiq to the pharmacy informed patient of the change and instructions

## 2020-07-21 NOTE — Telephone Encounter (Signed)
Pt says he cannot afford his medication, is there another alternative?

## 2020-07-21 NOTE — Telephone Encounter (Signed)
Patient verbalized understanding he states he can do anytime the end of January. His appointment is scheduled for 08/31/2020 [facility] 9[facility]15[facility] for a 9:30am scan. Nothing to eat or drink after midnight. Juliann Pulse going to see If they can add on the Hepatitis delta antibody. Through [facility]

## 2020-07-21 NOTE — Telephone Encounter (Signed)
-----   Message from Lin Landsman, MD sent at 07/21/2020  1:17 PM EST ----- We also ordered hepatitis delta antibody, not sure if this is being processed.  If not, please reorder hepatitis delta antibody as well as hepatitis delta antigen  RV

## 2020-07-28 DIAGNOSIS — B181 Chronic viral hepatitis B without delta-agent: Secondary | ICD-10-CM | POA: Diagnosis not present

## 2020-08-06 LAB — HEPATITIS D VIRUS (HDV) TOTAL: HDV Ab: NEGATIVE

## 2020-08-06 LAB — HEPATITIS D QRT-PCR (SERUM): HDV qRT-PCR (serum): NOT DETECTED

## 2020-08-19 LAB — HEPATITIS D ANTIGEN: Hepatitis D Antigen: NEGATIVE

## 2020-08-19 LAB — SPECIMEN STATUS REPORT

## 2020-08-31 ENCOUNTER — Ambulatory Visit
Admission: RE | Admit: 2020-08-31 | Discharge: 2020-08-31 | Disposition: A | Discharge status: Home / Self Care | Payer: Medicare Other | Source: Ambulatory Visit | Attending: Gastroenterology | Admitting: Gastroenterology

## 2020-08-31 ENCOUNTER — Other Ambulatory Visit: Payer: Self-pay

## 2020-08-31 ENCOUNTER — Telehealth: Payer: Self-pay

## 2020-08-31 DIAGNOSIS — B181 Chronic viral hepatitis B without delta-agent: Secondary | ICD-10-CM

## 2020-08-31 DIAGNOSIS — K760 Fatty (change of) liver, not elsewhere classified: Secondary | ICD-10-CM

## 2020-08-31 NOTE — Telephone Encounter (Signed)
Called and left a message order the liver biopsy and faxed paper to [facility]

## 2020-08-31 NOTE — Telephone Encounter (Signed)
-----   Message from Lin Landsman, MD sent at 08/31/2020 12:54 PM EST ----- I recommend US guided liver biopsy to evaluate fatty liver or chronic Hep B  RV

## 2020-09-01 NOTE — Telephone Encounter (Signed)
Called and left a message for call back  

## 2020-09-01 NOTE — Telephone Encounter (Signed)
Checked with [facility] and they received the form. They will call the patient to scheduled. Called patient and he verbalized understanding

## 2020-09-01 NOTE — Telephone Encounter (Signed)
Got patient scheduled for 09/14/2020 arrive to medical mall at 8:30am for a 9:30am scan. Patient verbalized understanding

## 2020-09-03 ENCOUNTER — Other Ambulatory Visit: Payer: Self-pay

## 2020-09-03 ENCOUNTER — Other Ambulatory Visit
Admission: RE | Admit: 2020-09-03 | Discharge: 2020-09-03 | Disposition: A | Discharge status: Home / Self Care | Payer: Medicare Other | Source: Ambulatory Visit | Attending: Gastroenterology | Admitting: Gastroenterology

## 2020-09-03 DIAGNOSIS — Z20822 Contact with and (suspected) exposure to covid-19: Secondary | ICD-10-CM | POA: Insufficient documentation

## 2020-09-03 DIAGNOSIS — Z01812 Encounter for preprocedural laboratory examination: Secondary | ICD-10-CM | POA: Insufficient documentation

## 2020-09-04 LAB — SARS CORONAVIRUS 2 (TAT 6-24 HRS): SARS Coronavirus 2: NEGATIVE

## 2020-09-07 ENCOUNTER — Ambulatory Visit: Payer: Medicare Other | Admitting: Nurse Practitioner

## 2020-09-07 ENCOUNTER — Ambulatory Visit: Payer: Medicare Other | Admitting: Anesthesiology

## 2020-09-07 ENCOUNTER — Ambulatory Visit
Admission: RE | Admit: 2020-09-07 | Discharge: 2020-09-07 | Disposition: A | Discharge status: Home / Self Care | Payer: Medicare Other | Attending: Gastroenterology | Admitting: Gastroenterology

## 2020-09-07 ENCOUNTER — Encounter
Admission: RE | Disposition: A | Discharge status: Home / Self Care | Payer: Self-pay | Source: Home / Self Care | Attending: Gastroenterology

## 2020-09-07 ENCOUNTER — Other Ambulatory Visit: Payer: Medicare Other

## 2020-09-07 DIAGNOSIS — K635 Polyp of colon: Secondary | ICD-10-CM

## 2020-09-07 DIAGNOSIS — Z1211 Encounter for screening for malignant neoplasm of colon: Secondary | ICD-10-CM

## 2020-09-07 DIAGNOSIS — Z79899 Other long term (current) drug therapy: Secondary | ICD-10-CM | POA: Diagnosis not present

## 2020-09-07 DIAGNOSIS — Z79890 Hormone replacement therapy (postmenopausal): Secondary | ICD-10-CM | POA: Insufficient documentation

## 2020-09-07 DIAGNOSIS — K625 Hemorrhage of anus and rectum: Secondary | ICD-10-CM | POA: Diagnosis not present

## 2020-09-07 DIAGNOSIS — D122 Benign neoplasm of ascending colon: Secondary | ICD-10-CM | POA: Diagnosis not present

## 2020-09-07 DIAGNOSIS — K529 Noninfective gastroenteritis and colitis, unspecified: Secondary | ICD-10-CM | POA: Insufficient documentation

## 2020-09-07 DIAGNOSIS — D128 Benign neoplasm of rectum: Secondary | ICD-10-CM | POA: Insufficient documentation

## 2020-09-07 DIAGNOSIS — D123 Benign neoplasm of transverse colon: Secondary | ICD-10-CM | POA: Diagnosis not present

## 2020-09-07 DIAGNOSIS — D124 Benign neoplasm of descending colon: Secondary | ICD-10-CM | POA: Diagnosis not present

## 2020-09-07 HISTORY — PX: COLONOSCOPY WITH PROPOFOL: SHX5780

## 2020-09-07 SURGERY — COLONOSCOPY WITH PROPOFOL
Anesthesia: General

## 2020-09-07 MED ORDER — PROPOFOL 500 MG/50ML IV EMUL
INTRAVENOUS | Status: AC
  Filled 2020-09-07: qty 50

## 2020-09-07 MED ORDER — PROPOFOL 10 MG/ML IV BOLUS
INTRAVENOUS | Status: AC
  Filled 2020-09-07: qty 20

## 2020-09-07 MED ORDER — SODIUM CHLORIDE 0.9 % IV SOLN
INTRAVENOUS | Status: DC
  Administered 2020-09-07: 20 mL/h via INTRAVENOUS

## 2020-09-07 MED ORDER — LIDOCAINE HCL (CARDIAC) PF 100 MG/5ML IV SOSY
PREFILLED_SYRINGE | INTRAVENOUS | Status: DC | PRN
  Administered 2020-09-07: 80 mg via INTRAVENOUS

## 2020-09-07 MED ORDER — PROPOFOL 10 MG/ML IV BOLUS
INTRAVENOUS | Status: DC | PRN
  Administered 2020-09-07: 80 mg via INTRAVENOUS

## 2020-09-07 MED ORDER — PROPOFOL 500 MG/50ML IV EMUL
INTRAVENOUS | Status: DC | PRN
  Administered 2020-09-07: 160 ug/kg/min via INTRAVENOUS

## 2020-09-07 NOTE — H&P (Signed)
Cephas Darby, MD [place]  [place], [place] [address]  Main: [phone]  Fax: [phone] Pager: [phone]  Primary Care Physician:  Guadalupe Maple, MD Primary Gastroenterologist:  Dr. Cephas Darby  Pre-Procedure History & Physical: HPI:  Jerry Frey is a 70 y.o. male is here for an colonoscopy.   Past Medical History:  Diagnosis Date  . Hypertension   . Thyroid disease     Past Surgical History:  Procedure Laterality Date  . L4 compression fracture      Prior to Admission medications   Medication Sig Start Date End Date Taking? Authorizing Provider  amLODipine (NORVASC) 5 MG tablet Take 1 tablet (5 mg total) by mouth daily. 06/05/20  Yes Cannady, Jolene T, NP  benazepril (LOTENSIN) 40 MG tablet Take 1 tablet (40 mg total) by mouth daily. 06/05/20  Yes Cannady, Henrine Screws T, NP  levothyroxine (SYNTHROID) 125 MCG tablet Take 125 mcg by mouth daily. 06/07/20  Yes [provider]    Allergies as of 07/17/2020  . (No Known Allergies)    Family History  Family history unknown: Yes    Social History   Socioeconomic History  . Marital status: Married    Spouse name: Not on file  . Number of children: Not on file  . Years of education: Not on file  . Highest education level: 12th grade  Occupational History  . Occupation: retired  Tobacco Use  . Smoking status: Never Smoker  . Smokeless tobacco: Never Used  Vaping Use  . Vaping Use: Never used  Substance and Sexual Activity  . Alcohol use: Not Currently    Alcohol/week: 7.0 standard drinks    Types: 7 Glasses of wine per week    Comment: per week  . Drug use: No  . Sexual activity: Not on file  Other Topics Concern  . Not on file  Social History Narrative  . Not on file   Social Determinants of Health   Financial Resource Strain: Low Risk   . Difficulty of Paying Living Expenses: Not hard at all  Food Insecurity: No Food Insecurity  . Worried About Sales executive in the Last Year: Never true  . Ran Out of Food in the Last Year: Never true  Transportation Needs: No Transportation Needs  . Lack of Transportation (Medical): No  . Lack of Transportation (Non-Medical): No  Physical Activity: Inactive  . Days of Exercise per Week: 0 days  . Minutes of Exercise per Session: 0 min  Stress: No Stress Concern Present  . Feeling of Stress : Not at all  Social Connections: Not on file  Intimate Partner Violence: Not on file    Review of Systems: See HPI, otherwise negative ROS  Physical Exam: BP (!) 154/101   Temp 98.2 F (36.8 C) (Oral)   Resp 20   SpO2 99%  General:   Alert,  pleasant and cooperative in NAD Head:  Normocephalic and atraumatic. Neck:  Supple; no masses or thyromegaly. Lungs:  Clear throughout to auscultation.    Heart:  Regular rate and rhythm. Abdomen:  Soft, nontender and nondistended. Normal bowel sounds, without guarding, and without rebound.   Neurologic:  Alert and  oriented x4;  grossly normal neurologically.  Impression/Plan: Jerry Frey is here for an colonoscopy to be performed for colon cancer screening  Risks, benefits, limitations, and alternatives regarding  colonoscopy have been reviewed with the patient.  Questions have been answered.  All parties agreeable.  Sherri Sear, MD  09/07/2020, 8:59 AM

## 2020-09-07 NOTE — Anesthesia Preprocedure Evaluation (Signed)
Anesthesia Evaluation  Patient identified by MRN, date of birth, ID band Patient awake    Reviewed: Allergy & Precautions, NPO status , Patient's Chart, lab work & pertinent test results  History of Anesthesia Complications Negative for: history of anesthetic complications  Airway Mallampati: II  TM Distance: >3 FB Neck ROM: Full    Dental no notable dental hx. (+) Teeth Intact   Pulmonary neg pulmonary ROS, neg sleep apnea, neg COPD, Patient abstained from smoking.Not current smoker,    Pulmonary exam normal breath sounds clear to auscultation       Cardiovascular Exercise Tolerance: Good METShypertension, (-) CAD and (-) Past MI (-) dysrhythmias  Rhythm:Regular Rate:Normal - Systolic murmurs    Neuro/Psych negative neurological ROS  negative psych ROS   GI/Hepatic neg GERD  ,(+)     (-) substance abuse  ,   Endo/Other  neg diabetesHypothyroidism   Renal/GU negative Renal ROS     Musculoskeletal   Abdominal   Peds  Hematology   Anesthesia Other Findings Past Medical History: No date: Hypertension No date: Thyroid disease  Reproductive/Obstetrics                             Anesthesia Physical Anesthesia Plan  ASA: II  Anesthesia Plan: General   Post-op Pain Management:    Induction: Intravenous  PONV Risk Score and Plan: 2 and Ondansetron, Propofol infusion and TIVA  Airway Management Planned: Nasal Cannula  Additional Equipment: None  Intra-op Plan:   Post-operative Plan:   Informed Consent: I have reviewed the patients History and Physical, chart, labs and discussed the procedure including the risks, benefits and alternatives for the proposed anesthesia with the patient or authorized representative who has indicated his/her understanding and acceptance.     Dental advisory given  Plan Discussed with: CRNA and Surgeon  Anesthesia Plan Comments: (Discussed risks  of anesthesia with patient, including possibility of difficulty with spontaneous ventilation under anesthesia necessitating airway intervention, PONV, and rare risks such as cardiac or respiratory or neurological events. Patient understands.)        Anesthesia Quick Evaluation

## 2020-09-07 NOTE — Transfer of Care (Signed)
Immediate Anesthesia Transfer of Care Note  Patient: Kyng Bachus  Procedure(s) Performed: COLONOSCOPY WITH PROPOFOL (N/A )  Patient Location: PACU  Anesthesia Type:General  Level of Consciousness: sedated  Airway & Oxygen Therapy: Patient Spontanous Breathing and Patient connected to nasal cannula oxygen  Post-op Assessment: Report given to RN and Post -op Vital signs reviewed and stable  Post vital signs: Reviewed and stable  Last Vitals:  Vitals Value Taken Time  BP 106/77 09/07/20 1003  Temp 35.8 C 09/07/20 1003  Pulse 67 09/07/20 1004  Resp 15 09/07/20 1004  SpO2 95 % 09/07/20 1004  Vitals shown include unvalidated device data.  Last Pain:  Vitals:   09/07/20 1003  TempSrc: Temporal  PainSc: Asleep         Complications: No complications documented.

## 2020-09-07 NOTE — Anesthesia Procedure Notes (Signed)
Date/Time: 09/07/2020 9:20 AM Performed by: Allean Found, CRNA Pre-anesthesia Checklist: Patient identified, Emergency Drugs available, Suction available, Patient being monitored and Timeout performed Patient Re-evaluated:Patient Re-evaluated prior to induction Oxygen Delivery Method: Nasal cannula Placement Confirmation: positive ETCO2

## 2020-09-07 NOTE — Op Note (Signed)
[facility] Gastroenterology Patient Name: Jerry Frey Procedure Date: 09/07/2020 9:19 AM MRN: [id number] Account #: [id number] Date of Birth: [birth year] Admit Type: Outpatient Age: 70 Room: [facility] ENDO ROOM 1 Gender: Male Note Status: Finalized Procedure:             Colonoscopy Indications:           Screening for colorectal malignant neoplasm Providers:             Lin Landsman MD, MD Medicines:             General Anesthesia Procedure:             Pre-Anesthesia Assessment:                        - Prior to the procedure, a History and Physical was                         performed, and patient medications and allergies were                         reviewed. The patient is competent. The risks and                         benefits of the procedure and the sedation options and                         risks were discussed with the patient. All questions                         were answered and informed consent was obtained.                         Patient identification and proposed procedure were                         verified by the physician, the nurse, the                         anesthesiologist, the anesthetist and the technician                         in the pre-procedure area in the procedure room in the                         endoscopy suite. Mental Status Examination: alert and                         oriented. Airway Examination: normal oropharyngeal                         airway and neck mobility. Respiratory Examination:                         clear to auscultation. CV Examination: normal.                         Prophylactic Antibiotics: The patient does not require  prophylactic antibiotics. Prior Anticoagulants: The                         patient has taken no previous anticoagulant or                         antiplatelet agents. ASA Grade Assessment: II - A                         patient with mild systemic  disease. After reviewing                         the risks and benefits, the patient was deemed in                         satisfactory condition to undergo the procedure. The                         anesthesia plan was to use general anesthesia.                         Immediately prior to administration of medications,                         the patient was re-assessed for adequacy to receive                         sedatives. The heart rate, respiratory rate, oxygen                         saturations, blood pressure, adequacy of pulmonary                         ventilation, and response to care were monitored                         throughout the procedure. The physical status of the                         patient was re-assessed after the procedure.                        After obtaining informed consent, the colonoscope was                         passed under direct vision. Throughout the procedure,                         the patient's blood pressure, pulse, and oxygen                         saturations were monitored continuously. The                         Colonoscope was introduced through the anus and                         advanced to the the cecum, identified by appendiceal  orifice and ileocecal valve. The colonoscopy was                         performed without difficulty. The patient tolerated                         the procedure well. The quality of the bowel                         preparation was evaluated using the BBPS [facility] Bowel                         Preparation Scale) with scores of: Right Colon = 3,                         Transverse Colon = 3 and Left Colon = 3 (entire mucosa                         seen well with no residual staining, small fragments                         of stool or opaque liquid). The total BBPS score                         equals 9. Findings:      The perianal and digital rectal examinations were normal.  Pertinent       negatives include normal sphincter tone and no palpable rectal lesions.      A 2 mm polyp was found in the cecum. The polyp was sessile. The polyp       was removed with a cold biopsy forceps. Resection and retrieval were       complete.      A 10 mm polyp was found in the ascending colon. The polyp was       semi-pedunculated. The polyp was removed with a hot snare. Resection and       retrieval were complete.      A 5 mm polyp was found in the ascending colon. The polyp was sessile.       The polyp was removed with a cold snare. Resection and retrieval were       complete.      A 2 mm polyp was found in the ascending colon. The polyp was sessile.       The polyp was removed with a cold biopsy forceps. Resection and       retrieval were complete.      A 6 mm polyp was found in the transverse colon. The polyp was sessile.       The polyp was removed with a cold snare. Resection and retrieval were       complete.      Two sessile polyps were found in the descending colon. The polyps were 5       to 6 mm in size. These polyps were removed with a cold snare. Resection       and retrieval were complete.      A 9 mm polyp was found in the rectum. The polyp was sessile. The polyp       was removed with a hot snare. Resection and retrieval were complete.  A 5 mm polyp was found in the rectum. The polyp was sessile. The polyp       was removed with a cold snare. Resection and retrieval were complete.      The retroflexed view of the distal rectum and anal verge was normal and       showed no anal or rectal abnormalities. Impression:            - One 2 mm polyp in the cecum, removed with a cold                         biopsy forceps. Resected and retrieved.                        - One 10 mm polyp in the ascending colon, removed with                         a hot snare. Resected and retrieved.                        - One 5 mm polyp in the ascending colon, removed with                          a cold snare. Resected and retrieved.                        - One 2 mm polyp in the ascending colon, removed with                         a cold biopsy forceps. Resected and retrieved.                        - One 6 mm polyp in the transverse colon, removed with                         a cold snare. Resected and retrieved.                        - Two 5 to 6 mm polyps in the descending colon,                         removed with a cold snare. Resected and retrieved.                        - One 9 mm polyp in the rectum, removed with a hot                         snare. Resected and retrieved.                        - One 5 mm polyp in the rectum, removed with a cold                         snare. Resected and retrieved.                        - The distal rectum and anal verge are normal on  retroflexion view. Recommendation:        - Discharge patient to home (with escort).                        - Resume previous diet today.                        - Continue present medications.                        - Await pathology results.                        - Repeat colonoscopy in 1-3 years for surveillance of                         multiple polyps.                        - Return to my office as previously scheduled. Procedure Code(s):     --- Professional ---                        [phone], Colonoscopy, flexible; with removal of                         tumor(s), polyp(s), or other lesion(s) by snare                         technique                        45380, 26, Colonoscopy, flexible; with biopsy, single                         or multiple Diagnosis Code(s):     --- Professional ---                        Z12.11, Encounter for screening for malignant neoplasm                         of colon                        K63.5, Polyp of colon                        K62.1, Rectal polyp CPT copyright 2019 American Medical Association. All rights  reserved. The codes documented in this report are preliminary and upon coder review may  be revised to meet current compliance requirements. Dr. Ulyess Mort Lin Landsman MD, MD 09/07/2020 10:02:14 AM This report has been signed electronically. Number of Addenda: 0 Note Initiated On: 09/07/2020 9:19 AM Scope Withdrawal Time: 0 hours 24 minutes 16 seconds  Total Procedure Duration: 0 hours 26 minutes 55 seconds  Estimated Blood Loss:  Estimated blood loss was minimal.      [facility]

## 2020-09-07 NOTE — Anesthesia Postprocedure Evaluation (Signed)
Anesthesia Post Note  Patient: Jerry Frey  Procedure(s) Performed: COLONOSCOPY WITH PROPOFOL (N/A )  Patient location during evaluation: Endoscopy Anesthesia Type: General Level of consciousness: awake and alert Pain management: pain level controlled Vital Signs Assessment: post-procedure vital signs reviewed and stable Respiratory status: spontaneous breathing, nonlabored ventilation, respiratory function stable and patient connected to nasal cannula oxygen Cardiovascular status: blood pressure returned to baseline and stable Postop Assessment: no apparent nausea or vomiting Anesthetic complications: no   No complications documented.   Last Vitals:  Vitals:   09/07/20 0827 09/07/20 1003  BP: (!) 154/101 106/77  Resp: 20   Temp: 36.8 C (!) 35.8 C  SpO2: 99%     Last Pain:  Vitals:   09/07/20 1003  TempSrc: Temporal  PainSc: Asleep                 Arita Miss

## 2020-09-08 ENCOUNTER — Encounter: Payer: Self-pay | Admitting: Gastroenterology

## 2020-09-08 LAB — SURGICAL PATHOLOGY

## 2020-09-11 ENCOUNTER — Encounter: Payer: Self-pay | Admitting: Gastroenterology

## 2020-09-11 ENCOUNTER — Other Ambulatory Visit: Payer: Self-pay | Admitting: Radiology

## 2020-09-11 NOTE — OR Nursing (Signed)
Pt called with pre procedure instruction, He states he didn't realize he would be here approximately 4-5 hours and needs a driver. He is going to discuss with his family. He was given number to schedulers in event he needed to reschedule.

## 2020-09-14 ENCOUNTER — Telehealth: Payer: Self-pay

## 2020-09-14 ENCOUNTER — Ambulatory Visit
Admission: RE | Admit: 2020-09-14 | Discharge: 2020-09-14 | Disposition: A | Discharge status: Home / Self Care | Payer: Medicare Other | Source: Ambulatory Visit | Attending: Gastroenterology | Admitting: Gastroenterology

## 2020-09-14 NOTE — Telephone Encounter (Signed)
Patient has already rescheduled the liver biopsy. He was calling to rescheduled the procedure

## 2020-09-14 NOTE — Telephone Encounter (Signed)
Patient LVM on Friday in regards to procedure he was having done today.  Message routed to Dr. Verlin Grills nurse.  Thanks,  [place], [place]

## 2020-09-15 NOTE — Telephone Encounter (Signed)
error 

## 2020-10-01 ENCOUNTER — Other Ambulatory Visit: Payer: Self-pay

## 2020-10-01 ENCOUNTER — Ambulatory Visit (INDEPENDENT_AMBULATORY_CARE_PROVIDER_SITE_OTHER): Payer: Medicare Other | Admitting: Gastroenterology

## 2020-10-01 ENCOUNTER — Encounter: Payer: Self-pay | Admitting: Gastroenterology

## 2020-10-01 VITALS — BP 155/96 | HR 101 | Temp 98.2°F | Ht 62.0 in | Wt 152.2 lb

## 2020-10-01 DIAGNOSIS — R7989 Other specified abnormal findings of blood chemistry: Secondary | ICD-10-CM

## 2020-10-01 NOTE — Progress Notes (Signed)
Jerry Darby, MD [address]  [place]  [place], [place] [address]  Main: [phone]  Fax: [phone]    Gastroenterology Consultation  Referring Provider:     Charlynne Cousins, MD Primary Care Physician:  Jerry Cousins, MD Primary Gastroenterologist:  Dr. Cephas Frey Reason for Consultation:  chronic Hep B         HPI:   Jerry Frey is a 70 y.o. male from [place] referred by Dr. Charlynne Cousins, MD  for consultation & management of hemorrhoidal prolapse. He reports noticing soft tissue coming out every time he defecates and reduces spontaneously after defecation for the last 2-3 months. He otherwise denies bleeding, itching or pain, constipation, diarrhea. He denies weight loss or abdominal pain. He saw his primary care doctor who started him on hydrocortisone hemorrhoid cream which he thinks is helping. He still continues to feel the tissue coming out. He had a colonoscopy about 5 years ago at [facility] and reportedly normal. However, I could not find the colonoscopy report  Of note, he has mildly elevated ALT from recent labs. He has hepatitis C antibody negative.  Denies any other GI symptoms.  Follow-up visit 08/29/2017: Since last visit, patient reports that the hemorrhoid prolapse has improved with topical hydrocortisone. He still see some tissue coming out but not as much as before. Also, he does have chronic hepatitis B infection with low viral load. He is probably in the phase of seroconversion. His hepatitis B E antibody positive, E antigen negative, surface antigen positive, core antibody positive. Ultrasound liver was normal.  Follow-up visit 07/17/2020 Patient is here for follow-up of history of chronic hepatitis B with low viral load. He reports gaining weight since he is retired and denies any GI symptoms today. His most recent labs from November 2021 revealed mildly elevated transaminases AST/ALT 65/52 as well as elevated serum triglycerides. He  continues to drink 2 glasses of wine daily  Follow-up visit 10/01/2020 Patient is here to discuss about ultrasound-guided liver biopsy.  Patient has chronic hepatitis B, low viral load, mildly elevated transaminases.  He underwent ultrasound liver which was unremarkable.  Patent portal vein based on the Doppler imaging.  Patient is scheduled to undergo ultrasound-guided liver biopsy to assess inflammation of the liver before initiating treatment for chronic hepatitis B.  He has the scheduled on 3/28.  Patient denies any symptoms today.  He also underwent colonoscopy which revealed several precancerous polyps.  NSAIDs: None  Antiplts/Anticoagulants/Anti thrombotics: None  Used to drink about 4-6 beers daily, currently drinks 2 glasses of wine daily Denies smoking Denies any GI surgeries Denies any family history of GI, liver conditions or malignancies GI Procedures: Colonoscopy about 5 years ago, reportedly normal per patient Colonoscopy 09/07/2020 - One 2 mm polyp in the cecum, removed with a cold biopsy forceps. Resected and retrieved. - One 10 mm polyp in the ascending colon, removed with a hot snare. Resected and retrieved. - One 5 mm polyp in the ascending colon, removed with a cold snare. Resected and retrieved. - One 2 mm polyp in the ascending colon, removed with a cold biopsy forceps. Resected and retrieved. - One 6 mm polyp in the transverse colon, removed with a cold snare. Resected and retrieved. - Two 5 to 6 mm polyps in the descending colon, removed with a cold snare. Resected and retrieved. - One 9 mm polyp in the rectum, removed with a hot snare. Resected and retrieved. - One 5 mm polyp in the rectum,  removed with a cold snare. Resected and retrieved. - The distal rectum and anal verge are normal on retroflexion view.  DIAGNOSIS:  A. COLON POLYP X1, CECUM; COLD BIOPSY:  - POLYPOID COLONIC MUCOSA WITH INTRAMUCOSAL LYMPHOID AGGREGATE AND FOCAL  MILD ACTIVE COLITIS.  - NEGATIVE  FOR DYSPLASIA AND MALIGNANCY.   B. COLON POLYP X3, ASCENDING; COLD BIOPSY AND HOT/COLD SNARE:  - TUBULAR ADENOMA, MULTIPLE FRAGMENTS.  - NEGATIVE FOR HIGH-GRADE DYSPLASIA AND MALIGNANCY.   C. COLON POLYP X1, TRANSVERSE; COLD SNARE:  - TUBULAR ADENOMA.  - NEGATIVE FOR HIGH-GRADE DYSPLASIA AND MALIGNANCY.   D. COLON POLYP X2, DESCENDING; COLD SNARE:  - SESSILE SERRATED POLYP, TWO FRAGMENTS.  - TUBULAR ADENOMA, FOUR FRAGMENTS.  - NEGATIVE FOR HIGH-GRADE DYSPLASIA AND MALIGNANCY.   E. RECTUM POLYP X2, HOT AND COLD SNARE:  - TUBULAR ADENOMA, ONE FRAGMENT.  - HYPERPLASTIC POLYP, TWO FRAGMENTS.  - NEGATIVE FOR HIGH-GRADE DYSPLASIA AND MALIGNANCY.   Past Medical History:  Diagnosis Date  . Hypertension   . Thyroid disease     Past Surgical History:  Procedure Laterality Date  . COLONOSCOPY WITH PROPOFOL N/A 09/07/2020   Procedure: COLONOSCOPY WITH PROPOFOL;  Surgeon: Jerry Landsman, MD;  Location: [facility] ENDOSCOPY;  Service: Gastroenterology;  Laterality: N/A;  . L4 compression fracture       Current Outpatient Medications:  .  amLODipine (NORVASC) 5 MG tablet, Take 1 tablet (5 mg total) by mouth daily., Disp: 90 tablet, Rfl: 4 .  benazepril (LOTENSIN) 40 MG tablet, Take 1 tablet (40 mg total) by mouth daily., Disp: 90 tablet, Rfl: 4 .  levothyroxine (SYNTHROID) 125 MCG tablet, Take 125 mcg by mouth daily., Disp: , Rfl:    Family History  Family history unknown: Yes     Social History   Tobacco Use  . Smoking status: Never Smoker  . Smokeless tobacco: Never Used  Vaping Use  . Vaping Use: Never used  Substance Use Topics  . Alcohol use: Not Currently    Alcohol/week: 7.0 standard drinks    Types: 7 Glasses of wine per week    Comment: per week  . Drug use: No    Allergies as of 10/01/2020  . (No Known Allergies)    Review of Systems:    All systems reviewed and negative except where noted in HPI.   Physical Exam:  BP (!) 155/96 (BP Location: Left Arm,  Patient Position: Sitting, Cuff Size: Normal)   Pulse (!) 101   Temp 98.2 F (36.8 C) (Oral)   Ht 5\' 2"  (1.575 m)   Wt 152 lb 4 oz (69.1 kg)   BMI 27.85 kg/m  No LMP for male patient.  General:   Alert,  Well-developed, well-nourished, pleasant and cooperative in NAD Head:  Normocephalic and atraumatic. Eyes:  Sclera clear, no icterus.   Conjunctiva pink. Ears:  Normal auditory acuity. Nose:  No deformity, discharge, or lesions. Mouth:  No deformity or lesions,oropharynx pink & moist. Neck:  Supple; no masses or thyromegaly. Lungs:  Respirations even and unlabored.  Clear throughout to auscultation.   No wheezes, crackles, or rhonchi. No acute distress. Heart:  Regular rate and rhythm; no murmurs, clicks, rubs, or gallops. Abdomen:  Normal bowel sounds. Soft, non-tender and non-distended without masses, hepatosplenomegaly or hernias noted.  No guarding or rebound tenderness.   Rectal: No external hemorrhoids appreciated, normal perianal skin.  Msk:  Symmetrical without gross deformities. Good, equal movement & strength bilaterally. Pulses:  Normal pulses noted. Extremities:  No clubbing  or edema.  No cyanosis. Neurologic:  Alert and oriented x3;  grossly normal neurologically. Skin:  Intact without significant lesions or rashes. No jaundice. Psych:  Alert and cooperative. Normal mood and affect.  Imaging Studies none  Assessment and Plan:   Jerry Frey is a 70 y.o. Asian male with history of grade 3 internal hemorrhoid and chronic hepatitis B infection here for follow-up.  Chronic hepatitis B infection with history of low viral load He completely eliminated alcohol use Elevated transaminases HCV antibody negative check HIV negative, hep A antibody total reactive HBV DNA low viral load, surface antigen negative, surface antibody reactive, core antibody total positive, hepatitis delta antibody negative, E antigen negative, E antibody positive Ultrasound with Dopplers  unremarkable, scheduled to undergo ultrasound-guided liver biopsy on 3/28 to determine treatment for chronic hepatitis B.  I have answered all his concerns regarding ultrasound-guided liver biopsy today.  Explained to him the technique, risks and benefits of the procedure Recheck LFTs every 3 months  Personal history of precancerous colon polyps Recommend surveillance colonoscopy in 09/2023  Follow up in 3 months    Jerry Darby, MD

## 2020-10-12 ENCOUNTER — Other Ambulatory Visit: Payer: Medicare Other

## 2020-10-12 ENCOUNTER — Other Ambulatory Visit: Payer: Self-pay

## 2020-10-12 DIAGNOSIS — Z8619 Personal history of other infectious and parasitic diseases: Secondary | ICD-10-CM | POA: Diagnosis not present

## 2020-10-12 DIAGNOSIS — R7301 Impaired fasting glucose: Secondary | ICD-10-CM | POA: Diagnosis not present

## 2020-10-12 DIAGNOSIS — R7989 Other specified abnormal findings of blood chemistry: Secondary | ICD-10-CM

## 2020-10-12 LAB — MICROALBUMIN, URINE WAIVED
Creatinine, Urine Waived: 50 mg/dL (ref 10–300)
Microalb, Ur Waived: 10 mg/L (ref 0–19)
Microalb/Creat Ratio: <30 mg/g (ref <30)

## 2020-10-13 DIAGNOSIS — E038 Other specified hypothyroidism: Secondary | ICD-10-CM | POA: Diagnosis not present

## 2020-10-13 DIAGNOSIS — E221 Hyperprolactinemia: Secondary | ICD-10-CM | POA: Diagnosis not present

## 2020-10-13 LAB — HEPATIC FUNCTION PANEL
ALT: 20 IU/L (ref 0–44)
AST: 17 IU/L (ref 0–40)
Albumin: 4.6 g/dL (ref 3.7–4.7)
Alkaline Phosphatase: 61 IU/L (ref 44–121)
Bilirubin Total: 0.2 mg/dL (ref 0.0–1.2)
Bilirubin, Direct: <0.1 mg/dL (ref 0.00–0.40)
Total Protein: 7.4 g/dL (ref 6.0–8.5)

## 2020-10-13 LAB — HEMOGLOBIN A1C
Est. average glucose Bld gHb Est-mCnc: 120 mg/dL
Hgb A1c MFr Bld: 5.8 % — ABNORMAL HIGH (ref 4.8–5.6)

## 2020-10-13 NOTE — Progress Notes (Signed)
Good morning, please let Jerry Frey know his labs have returned.  Liver function testing has returned to normal range.  The A1C is the diabetes testing we talked about, this looks at your blood sugars over the past 3 months and turns the average into a number.  Your number is 5.8%, meaning you are still prediabetic but no worsening.  Any number 5.7 to 6.4 is considered prediabetes and any number 6.5 or greater is considered diabetes.  I would recommend heavy focus on decreasing foods high in sugar and your intake of things like bread products, pasta, and rice.  The American Diabetes Association online has a large amount of information on diet changes to make.  We will recheck this number at next visit to ensure you are not continuing to trend upwards and move into diabetes.  Have a good day.

## 2020-10-14 ENCOUNTER — Telehealth: Payer: Self-pay | Admitting: Gastroenterology

## 2020-10-14 NOTE — Telephone Encounter (Signed)
Patient called, wants to cancel [place] scheduled on 10/26/20

## 2020-10-14 NOTE — Telephone Encounter (Signed)
[phone] option 2 and then 3. Gave him this information and we call and get it cancel

## 2020-10-15 ENCOUNTER — Ambulatory Visit: Payer: Medicare Other | Admitting: Gastroenterology

## 2020-10-19 NOTE — Telephone Encounter (Signed)
error 

## 2020-10-26 ENCOUNTER — Ambulatory Visit: Payer: Medicare Other

## 2021-01-19 ENCOUNTER — Other Ambulatory Visit: Payer: Self-pay

## 2021-01-19 ENCOUNTER — Ambulatory Visit
Admission: RE | Admit: 2021-01-19 | Discharge: 2021-01-19 | Disposition: A | Discharge status: Home / Self Care | Payer: Medicare Other | Source: Ambulatory Visit | Attending: Internal Medicine | Admitting: Internal Medicine

## 2021-01-19 ENCOUNTER — Telehealth: Payer: Self-pay

## 2021-01-19 ENCOUNTER — Encounter: Payer: Self-pay | Admitting: Internal Medicine

## 2021-01-19 ENCOUNTER — Ambulatory Visit (INDEPENDENT_AMBULATORY_CARE_PROVIDER_SITE_OTHER): Payer: Medicare Other | Admitting: Internal Medicine

## 2021-01-19 ENCOUNTER — Ambulatory Visit
Admission: RE | Admit: 2021-01-19 | Discharge: 2021-01-19 | Disposition: A | Discharge status: Home / Self Care | Payer: Medicare Other | Source: Home / Self Care | Attending: Internal Medicine | Admitting: Internal Medicine

## 2021-01-19 VITALS — BP 125/83 | HR 103 | Temp 98.7°F | Ht 63.27 in | Wt 151.2 lb

## 2021-01-19 DIAGNOSIS — R059 Cough, unspecified: Secondary | ICD-10-CM | POA: Diagnosis not present

## 2021-01-19 DIAGNOSIS — I10 Essential (primary) hypertension: Secondary | ICD-10-CM | POA: Diagnosis not present

## 2021-01-19 DIAGNOSIS — Z125 Encounter for screening for malignant neoplasm of prostate: Secondary | ICD-10-CM | POA: Diagnosis not present

## 2021-01-19 DIAGNOSIS — J45909 Unspecified asthma, uncomplicated: Secondary | ICD-10-CM

## 2021-01-19 DIAGNOSIS — Z136 Encounter for screening for cardiovascular disorders: Secondary | ICD-10-CM | POA: Diagnosis not present

## 2021-01-19 DIAGNOSIS — Z131 Encounter for screening for diabetes mellitus: Secondary | ICD-10-CM

## 2021-01-19 DIAGNOSIS — R7309 Other abnormal glucose: Secondary | ICD-10-CM

## 2021-01-19 DIAGNOSIS — E038 Other specified hypothyroidism: Secondary | ICD-10-CM | POA: Diagnosis not present

## 2021-01-19 DIAGNOSIS — N400 Enlarged prostate without lower urinary tract symptoms: Secondary | ICD-10-CM

## 2021-01-19 MED ORDER — ALBUTEROL SULFATE HFA 108 (90 BASE) MCG/ACT IN AERS: 2.0000 | INHALATION_SPRAY | Freq: Four times a day (QID) | RESPIRATORY_TRACT | 0 refills | Status: DC | PRN

## 2021-01-19 MED ORDER — AMOXICILLIN-POT CLAVULANATE 500-125 MG PO TABS: 1.0000 | ORAL_TABLET | Freq: Two times a day (BID) | ORAL | 0 refills | Status: DC

## 2021-01-19 MED ORDER — FEXOFENADINE HCL 180 MG PO TABS: 180.0000 mg | ORAL_TABLET | Freq: Every day | ORAL | 2 refills | Status: DC

## 2021-01-19 MED ORDER — METHYLPREDNISOLONE 4 MG PO TBPK: ORAL_TABLET | ORAL | 1 refills | Status: DC

## 2021-01-19 MED ORDER — BENZONATATE 100 MG PO CAPS: 100.0000 mg | ORAL_CAPSULE | Freq: Three times a day (TID) | ORAL | 0 refills | Status: AC | PRN

## 2021-01-19 MED ORDER — BENZONATATE 100 MG PO CAPS: 100.0000 mg | ORAL_CAPSULE | Freq: Three times a day (TID) | ORAL | 0 refills | Status: DC | PRN

## 2021-01-19 MED ORDER — AMOXICILLIN-POT CLAVULANATE 500-125 MG PO TABS: 1.0000 | ORAL_TABLET | Freq: Two times a day (BID) | ORAL | 0 refills | Status: AC

## 2021-01-19 NOTE — Progress Notes (Signed)
BP 125/83   Pulse (!) 103   Temp 98.7 F (37.1 C) (Oral)   Ht 5' 3.27" (1.607 m)   Wt 151 lb 3.2 oz (68.6 kg)   SpO2 95%   BMI 26.56 kg/m    Subjective:    Patient ID: Jerry Frey, male    DOB: 05/22/1951, 70 y.o.   MRN: [id number]  Chief Complaint  Patient presents with  . Cough    Had Covid last month.  Has been keeping up at night.. for the past 2 to 3 monts  . Wheezing    Has been using daughters meds, has had for the past 2 to 3 months  . sneezing  . watery eyes  . Allergies  . Sore Throat    HPI: Jerry Frey is a 70 y.o. male  Cough This is a new problem. The current episode started in the past 7 days. The problem has been gradually improving. The cough is Productive of purulent sputum. Associated symptoms include nasal congestion and wheezing. Pertinent negatives include no rhinorrhea, sore throat or shortness of breath.  Wheezing  This is a recurrent problem. The current episode started 1 to 4 weeks ago. Associated symptoms include coughing. Pertinent negatives include no rhinorrhea, shortness of breath or sore throat.  Sore Throat  This is a new problem. Associated symptoms include coughing. Pertinent negatives include no shortness of breath.   Chief Complaint  Patient presents with  . Cough    Had Covid last month.  Has been keeping up at night.. for the past 2 to 3 monts  . Wheezing    Has been using daughters meds, has had for the past 2 to 3 months  . sneezing  . watery eyes  . Allergies  . Sore Throat    Relevant past medical, surgical, family and social history reviewed and updated as indicated. Interim medical history since our last visit reviewed. Allergies and medications reviewed and updated.  Review of Systems  HENT:  Negative for rhinorrhea and sore throat.   Respiratory:  Positive for cough and wheezing. Negative for shortness of breath.    Per HPI unless specifically indicated above     Objective:    BP 125/83   Pulse  (!) 103   Temp 98.7 F (37.1 C) (Oral)   Ht 5' 3.27" (1.607 m)   Wt 151 lb 3.2 oz (68.6 kg)   SpO2 95%   BMI 26.56 kg/m   Wt Readings from Last 3 Encounters:  01/19/21 151 lb 3.2 oz (68.6 kg)  10/01/20 152 lb 4 oz (69.1 kg)  09/07/20 154 lb 5.2 oz (70 kg)    Physical Exam  Results for orders placed or performed in visit on 10/12/20  Microalbumin, Urine Waived  Result Value Ref Range   Microalb, Ur Waived 10 0 - 19 mg/L   Creatinine, Urine Waived 50 10 - 300 mg/dL   Microalb/Creat Ratio <30 <30 mg/g  HgB A1c  Result Value Ref Range   Hgb A1c MFr Bld 5.8 (H) 4.8 - 5.6 %   Est. average glucose Bld gHb Est-mCnc 120 mg/dL  Hepatic function panel  Result Value Ref Range   Total Protein 7.4 6.0 - 8.5 g/dL   Albumin 4.6 3.7 - 4.7 g/dL   Bilirubin Total 0.2 0.0 - 1.2 mg/dL   Bilirubin, Direct <0.10 0.00 - 0.40 mg/dL   Alkaline Phosphatase 61 44 - 121 IU/L   AST 17 0 - 40 IU/L   ALT 20 0 -  44 IU/L        Current Outpatient Medications:  .  amLODipine (NORVASC) 5 MG tablet, Take 1 tablet (5 mg total) by mouth daily., Disp: 90 tablet, Rfl: 4 .  benazepril (LOTENSIN) 40 MG tablet, Take 1 tablet (40 mg total) by mouth daily., Disp: 90 tablet, Rfl: 4 .  levothyroxine (SYNTHROID) 125 MCG tablet, Take 125 mcg by mouth daily., Disp: , Rfl:     Assessment & Plan:  Asthma  ? Wheezing  Will start pt on medrol dose pak, allegra at night for post COVID cough / asthma. Will start tessalon pearls.    Problem List Items Addressed This Visit   None    Orders Placed This Encounter  Procedures  . Pulmonary Function Test     Meds ordered this encounter  Medications  . albuterol (VENTOLIN HFA) 108 (90 Base) MCG/ACT inhaler    Sig: Inhale 2 puffs into the lungs every 6 (six) hours as needed for wheezing or shortness of breath.    Dispense:  8 g    Refill:  0  . fexofenadine (ALLEGRA ALLERGY) 180 MG tablet    Sig: Take 1 tablet (180 mg total) by mouth daily.    Dispense:  30 tablet     Refill:  2  . methylPREDNISolone (MEDROL DOSEPAK) 4 MG TBPK tablet    Sig: Use as directed    Dispense:  1 each    Refill:  1  . benzonatate (TESSALON) 100 MG capsule    Sig: Take 1 capsule (100 mg total) by mouth 3 (three) times daily as needed for up to 20 days for cough.    Dispense:  20 capsule    Refill:  0     Follow up plan: No follow-ups on file.

## 2021-01-19 NOTE — Telephone Encounter (Signed)
Copied from [place] [id number]. Topic: General - Other >> Jan 19, 2021  3:03 PM Pawlus, Brayton Layman A wrote: Reason for CRM: Pt stated the 5 prescriptions sent in today (6/21) were sent to the wrong pharmacy, pt requests all medications be sent to [facility] (N), [place].

## 2021-01-19 NOTE — Telephone Encounter (Signed)
Patient medications sent to correct pharmacy by [facility].

## 2021-02-04 DIAGNOSIS — R059 Cough, unspecified: Secondary | ICD-10-CM | POA: Diagnosis not present

## 2021-02-05 ENCOUNTER — Other Ambulatory Visit: Payer: Self-pay

## 2021-02-05 DIAGNOSIS — J984 Other disorders of lung: Secondary | ICD-10-CM

## 2021-02-05 NOTE — Progress Notes (Signed)
Please let him know there are calcifications on the xray and he needs to fu with pulmonology - Can we please refer this pt to pulm thnx.

## 2021-02-05 NOTE — Progress Notes (Signed)
Do not see  PFT results ordered last visit, given  Pulmonary calcifications/ chronic cough will refer to Pulm , pt has a fu on 8/1 will need to get PFTS before then. Will  cordinate with referral specialist Ellianah Cordy

## 2021-02-22 ENCOUNTER — Other Ambulatory Visit: Payer: Self-pay

## 2021-02-22 ENCOUNTER — Other Ambulatory Visit: Payer: Medicare Other

## 2021-02-22 DIAGNOSIS — R7309 Other abnormal glucose: Secondary | ICD-10-CM

## 2021-02-22 DIAGNOSIS — I10 Essential (primary) hypertension: Secondary | ICD-10-CM

## 2021-02-22 DIAGNOSIS — N400 Enlarged prostate without lower urinary tract symptoms: Secondary | ICD-10-CM

## 2021-02-22 DIAGNOSIS — Z125 Encounter for screening for malignant neoplasm of prostate: Secondary | ICD-10-CM

## 2021-02-22 DIAGNOSIS — Z131 Encounter for screening for diabetes mellitus: Secondary | ICD-10-CM

## 2021-02-22 DIAGNOSIS — Z136 Encounter for screening for cardiovascular disorders: Secondary | ICD-10-CM

## 2021-02-22 DIAGNOSIS — E038 Other specified hypothyroidism: Secondary | ICD-10-CM

## 2021-02-22 LAB — BAYER DCA HB A1C WAIVED: HB A1C (BAYER DCA - WAIVED): 5.8 % (ref <7.0)

## 2021-02-23 LAB — PSA TOTAL+% FREE (SERIAL)
PSA, Free Pct: 47.1 %
PSA, Free: 0.33 ng/mL
Prostate Specific Ag, Serum: 0.7 ng/mL (ref 0.0–4.0)

## 2021-02-23 LAB — CBC WITH DIFFERENTIAL/PLATELET
Basophils Absolute: 0 10*3/uL (ref 0.0–0.2)
Basos: 1 %
EOS (ABSOLUTE): 0.3 10*3/uL (ref 0.0–0.4)
Eos: 6 %
Hematocrit: 46.2 % (ref 37.5–51.0)
Hemoglobin: 14.9 g/dL (ref 13.0–17.7)
Immature Grans (Abs): 0 10*3/uL (ref 0.0–0.1)
Immature Granulocytes: 0 %
Lymphocytes Absolute: 1.6 10*3/uL (ref 0.7–3.1)
Lymphs: 30 %
MCH: 29.3 pg (ref 26.6–33.0)
MCHC: 32.3 g/dL (ref 31.5–35.7)
MCV: 91 fL (ref 79–97)
Monocytes Absolute: 0.5 10*3/uL (ref 0.1–0.9)
Monocytes: 8 %
Neutrophils Absolute: 3 10*3/uL (ref 1.4–7.0)
Neutrophils: 55 %
Platelets: 174 10*3/uL (ref 150–450)
RBC: 5.09 x10E6/uL (ref 4.14–5.80)
RDW: 14.8 % (ref 11.6–15.4)
WBC: 5.4 10*3/uL (ref 3.4–10.8)

## 2021-02-23 LAB — COMPREHENSIVE METABOLIC PANEL
ALT: 19 IU/L (ref 0–44)
AST: 23 IU/L (ref 0–40)
Albumin/Globulin Ratio: 2 (ref 1.2–2.2)
Albumin: 4.7 g/dL (ref 3.7–4.7)
Alkaline Phosphatase: 52 IU/L (ref 44–121)
BUN/Creatinine Ratio: 24 (ref 10–24)
BUN: 20 mg/dL (ref 8–27)
Bilirubin Total: 0.4 mg/dL (ref 0.0–1.2)
CO2: 22 mmol/L (ref 20–29)
Calcium: 9.4 mg/dL (ref 8.6–10.2)
Chloride: 105 mmol/L (ref 96–106)
Creatinine, Ser: 0.85 mg/dL (ref 0.76–1.27)
Globulin, Total: 2.4 g/dL (ref 1.5–4.5)
Glucose: 95 mg/dL (ref 65–99)
Potassium: 4.7 mmol/L (ref 3.5–5.2)
Sodium: 143 mmol/L (ref 134–144)
Total Protein: 7.1 g/dL (ref 6.0–8.5)
eGFR: 93 mL/min/{1.73_m2} (ref >59)

## 2021-02-23 LAB — LIPID PANEL
Chol/HDL Ratio: 3.5 ratio (ref 0.0–5.0)
Cholesterol, Total: 204 mg/dL — ABNORMAL HIGH (ref 100–199)
HDL: 58 mg/dL (ref >39)
LDL Chol Calc (NIH): 132 mg/dL — ABNORMAL HIGH (ref 0–99)
Triglycerides: 75 mg/dL (ref 0–149)
VLDL Cholesterol Cal: 14 mg/dL (ref 5–40)

## 2021-02-23 LAB — THYROID PANEL WITH TSH
Free Thyroxine Index: 1.8 (ref 1.2–4.9)
T3 Uptake Ratio: 30 % (ref 24–39)
T4, Total: 5.9 ug/dL (ref 4.5–12.0)
TSH: <0.005 u[IU]/mL — ABNORMAL LOW (ref 0.450–4.500)

## 2021-02-23 NOTE — Progress Notes (Signed)
Needs FT4 as well please have him get this checked will refer to Endocrine.

## 2021-02-25 ENCOUNTER — Encounter: Payer: Self-pay | Admitting: Internal Medicine

## 2021-02-25 ENCOUNTER — Ambulatory Visit: Payer: Medicare Other | Admitting: Internal Medicine

## 2021-02-25 ENCOUNTER — Telehealth: Payer: Self-pay | Admitting: Internal Medicine

## 2021-02-25 ENCOUNTER — Other Ambulatory Visit: Payer: Self-pay

## 2021-02-25 ENCOUNTER — Other Ambulatory Visit
Admission: RE | Admit: 2021-02-25 | Discharge: 2021-02-25 | Disposition: A | Discharge status: Home / Self Care | Payer: Medicare Other | Source: Ambulatory Visit | Attending: Internal Medicine | Admitting: Internal Medicine

## 2021-02-25 DIAGNOSIS — R053 Chronic cough: Secondary | ICD-10-CM | POA: Diagnosis not present

## 2021-02-25 DIAGNOSIS — J948 Other specified pleural conditions: Secondary | ICD-10-CM | POA: Diagnosis not present

## 2021-02-25 DIAGNOSIS — I10 Essential (primary) hypertension: Secondary | ICD-10-CM

## 2021-02-25 LAB — BASIC METABOLIC PANEL
Anion gap: 11 (ref 5–15)
BUN: 20 mg/dL (ref 8–23)
CO2: 25 mmol/L (ref 22–32)
Calcium: 9.2 mg/dL (ref 8.9–10.3)
Chloride: 102 mmol/L (ref 98–111)
Creatinine, Ser: 0.69 mg/dL (ref 0.61–1.24)
GFR, Estimated: >60 mL/min (ref >60)
Glucose, Bld: 96 mg/dL (ref 70–99)
Potassium: 4 mmol/L (ref 3.5–5.1)
Sodium: 138 mmol/L (ref 135–145)

## 2021-02-25 LAB — CBC WITH DIFFERENTIAL/PLATELET
Abs Immature Granulocytes: 0.01 10*3/uL (ref 0.00–0.07)
Basophils Absolute: 0 10*3/uL (ref 0.0–0.1)
Basophils Relative: 1 %
Eosinophils Absolute: 0.2 10*3/uL (ref 0.0–0.5)
Eosinophils Relative: 4 %
HCT: 44.8 % (ref 39.0–52.0)
Hemoglobin: 15.1 g/dL (ref 13.0–17.0)
Immature Granulocytes: 0 %
Lymphocytes Relative: 28 %
Lymphs Abs: 1.6 10*3/uL (ref 0.7–4.0)
MCH: 29.7 pg (ref 26.0–34.0)
MCHC: 33.7 g/dL (ref 30.0–36.0)
MCV: 88 fL (ref 80.0–100.0)
Monocytes Absolute: 0.5 10*3/uL (ref 0.1–1.0)
Monocytes Relative: 8 %
Neutro Abs: 3.3 10*3/uL (ref 1.7–7.7)
Neutrophils Relative %: 59 %
Platelets: 168 10*3/uL (ref 150–400)
RBC: 5.09 MIL/uL (ref 4.22–5.81)
RDW: 14.8 % (ref 11.5–15.5)
WBC: 5.6 10*3/uL (ref 4.0–10.5)
nRBC: 0 % (ref 0.0–0.2)

## 2021-02-25 LAB — SEDIMENTATION RATE: Sed Rate: 2 mm/hr (ref 0–20)

## 2021-02-25 MED ORDER — OLMESARTAN MEDOXOMIL 40 MG PO TABS: 40.0000 mg | ORAL_TABLET | Freq: Every day | ORAL | 11 refills | Status: DC

## 2021-02-25 NOTE — Progress Notes (Signed)
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    History of Present Illness  02/25/2021  Pulmonary/ 1st office eval/ Shelly Shoultz / [facility]  Chief Complaint  Patient presents with   Consult    pulmonary calcification ref [name]   Dyspnea:  very active but no heave ex/ Not limited by breathing from desired activities   Cough: bad cough esp allergy season some better with zyrtec or allergra, recently completed steroids for flare p covid and  was on prn saba  but says was "sick from Feb 2022 to a few weeks prior to [place] " with cough/ wheeze/ nasal congestion Sleep: fine  SABA use: not using now   No obvious additional  day to day or daytime variability or assoc excess/ purulent sputum or mucus plugs or hemoptysis or cp or chest tightness, subjective wheeze or overt sinus or hb symptoms.   Sleeping now  without nocturnal  or early am exacerbation  of respiratory  c/o's or need for noct saba. Also denies any obvious fluctuation of symptoms with weather or environmental changes or other aggravating or alleviating factors except as outlined above   No unusual exposure hx or h/o childhood pna/ asthma or knowledge of premature birth.  Current Allergies, Complete Past Medical History, Past Surgical History, Family History, and Social History were reviewed in [employer] record.  ROS  The following are not active complaints unless bolded Hoarseness, sore throat, dysphagia, dental problems, itching, sneezing,  nasal congestion or discharge of excess mucus or purulent secretions, ear ache,   fever, chills, sweats, unintended wt loss or wt gain, classically pleuritic or exertional cp,   orthopnea pnd or arm/hand swelling  or leg swelling, presyncope, palpitations, abdominal pain, anorexia, nausea, vomiting, diarrhea  or change in bowel habits or change in bladder habits, change in stools or change in urine, dysuria, hematuria,  rash, arthralgias, visual complaints, headache, numbness, weakness or ataxia or problems with walking or coordination,  change in mood or  memory.             Past Medical History:  Diagnosis Date   Hypertension    Thyroid disease     Outpatient Medications Prior to Visit  Medication Sig Dispense Refill   amLODipine (NORVASC) 5 MG tablet Take 1 tablet (5 mg total) by mouth daily. 90 tablet 4   benazepril (LOTENSIN) 40 MG tablet Take 1 tablet (40 mg total) by mouth daily. 90 tablet 4   levothyroxine (SYNTHROID) 125 MCG tablet Take 125 mcg by mouth daily.     albuterol (VENTOLIN HFA) 108 (90 Base) MCG/ACT inhaler Inhale 2 puffs into the lungs every 6 (six) hours as needed for wheezing or shortness of breath. (Patient not taking: Reported on 02/25/2021) 8 g 0   fexofenadine (ALLEGRA ALLERGY) 180 MG tablet Take 1 tablet (180 mg total) by mouth daily. (Patient not taking: Reported on 02/25/2021) 30 tablet 2   methylPREDNISolone (MEDROL DOSEPAK) 4 MG TBPK tablet Use as directed (Patient not taking: Reported on 02/25/2021) 1 each 1   No facility-administered medications prior to visit.     Objective:     BP (!) 160/80 (BP Location:  Left Arm, Patient Position: Sitting, Cuff Size: Normal)   Pulse 83   Temp 98.4 F (36.9 C) (Oral)   Ht 5' 4"  (1.626 m)   Wt 153 lb 3.2 oz (69.5 kg)   SpO2 100%   BMI 26.30 kg/m   SpO2: 100 %  Amb pleasant asian male with freq throat clearing  HEENT : pt wearing mask not removed for exam due to covid -19 concerns.    NECK :  without JVD/Nodes/TM/ nl carotid upstrokes bilaterally   LUNGS: no acc muscle use,  Nl contour chest (no scars or deformities corresponding to calcificaions) which is clear to A and P  bilaterally without cough on insp or exp maneuvers   CV:  RRR  no s3 or murmur or increase in P2, and no edema   ABD:  soft and nontender with nl inspiratory excursion in the supine position. No bruits or organomegaly appreciated, bowel sounds nl  MS:  Nl gait/ ext warm without deformities, calf tenderness, cyanosis or clubbing No obvious joint restrictions   SKIN: warm and dry without lesions    NEURO:  alert, approp, nl sensorium with  no motor or cerebellar deficits apparent.    I personally reviewed images and agree with radiology impression as follows:  CXR:   pa and lateral 01/19/21 1. No acute findings or change from remote CT. 2. Chronic pleuroparenchymal scarring and pleural calcifications at the right lung base. Note CT July 1999 same findings       Assessment   Pleural calcification on R  Dates back to 1999 radiographically s antecedent hx of lung trauma/ infection or asbestos exp  - Labs ordered 02/25/2021  :  ESR. Quant TB   Since no ext scar most likely this was result of empyema in pt s access to abx as child as apparently present when arrived in [place] > no additional w/u needed    Chronic cough Seasonal pattern assoc with rhinits x years but lasts feb to June  - Allergy profile 02/25/2021 >  Eos 0. /  IgE  Pending   I strongly suspect the ACEi is serving as an accelerant for Upper airway cough syndrome (previously labeled PNDS),  is so named because it's frequently impossible to sort out how much is  CR/sinusitis with freq throat clearing (which can be related to primary GERD)   vs  causing  secondary (" extra esophageal")  GERD from wide swings in gastric pressure that occur with throat clearing, often  promoting self use of mint and menthol lozenges that reduce the lower esophageal sphincter tone and exacerbate the problem further in a cyclical fashion.   These are the same pts (now being labeled as having "irritable larynx syndrome" by some cough centers) who not  infrequently have a history of having failed to tolerate ace inhibitors,  dry powder inhalers or biphosphonates or report having atypical/extraesophageal reflux symptoms that don't respond to standard doses of PPI  and are easily confused as having aecopd or asthma flares by even experienced allergists/ pulmonologists (myself included).   rec  First try off acei x one year to see if better results from just using otcs like zyrtec or allergra and if not and if allergy screen positive would consider formal allergy eval next.      Essential hypertension Trial off ACEi 02/25/2021 due to poorly controlled rhinitis/ cough   ACE inhibitors are problematic in  pts with airway complaints because  even experienced pulmonologists  can't always distinguish ace  effects from asthma/ allergies.  By themselves they don't actually cause a problem, much like oxygen can't by itself start a fire, but they certainly serve as a powerful catalyst or enhancer for any "fire"  or inflammatory process in the upper airway, be it caused by a viral uri or allergic rhinitis mech, either of which may apply here.   In the era of ARB near equivalency until we have a better handle on the reversibility of the airway problem, it just makes sense to avoid ACEI  Entirely for the next year  and then decide later, having established a level of airway control using a reasonable limited regimen (just otc antihistamines in his case hopefully) , whether to add back ace but even then being very careful to observe the pt for worsening airway control and number of meds used/ needed to control symptoms.   >> try olmesartan 40 mg daily and f/u with PCP if respiratory symptoms improve and return here if recur and can't be controlled more easily with available otcs      I had an extended discussion with the patient and reviewed all relevant studies so total time was 45 minutes with moderate level of MDM.  Each maintenance medication was reviewed in  detail including most importantly the difference between maintenance and prns and under what circumstances the prns are to be triggered using an action plan format that is not reflected in the computer generated alphabetically organized AVS.     Please see AVS for specific instructions unique to this visit that I personally wrote and verbalized to the the pt in detail and then reviewed with pt  by my nurse highlighting any  changes in therapy recommended at today's visit to their plan of care.     Christinia Gully, MD 02/25/2021

## 2021-02-25 NOTE — Patient Instructions (Signed)
Stop lisinopril 40 mg daily and replace with olmesartan 40 mg one day for a year to see if your allergies get a lot better  No further xrays needed  Please remember to go to the lab department   for your tests - we will call you with the results when they are available.     Pulmonary follow up is as needed

## 2021-02-25 NOTE — Assessment & Plan Note (Signed)
Seasonal pattern assoc with rhinits x years but lasts feb to June  - Allergy profile 02/25/2021 >  Eos 0. /  IgE  Pending   I strongly suspect the ACEi is serving as an accelerant for Upper airway cough syndrome (previously labeled PNDS),  is so named because it's frequently impossible to sort out how much is  CR/sinusitis with freq throat clearing (which can be related to primary GERD)   vs  causing  secondary (" extra esophageal")  GERD from wide swings in gastric pressure that occur with throat clearing, often  promoting self use of mint and menthol lozenges that reduce the lower esophageal sphincter tone and exacerbate the problem further in a cyclical fashion.   These are the same pts (now being labeled as having "irritable larynx syndrome" by some cough centers) who not infrequently have a history of having failed to tolerate ace inhibitors,  dry powder inhalers or biphosphonates or report having atypical/extraesophageal reflux symptoms that don't respond to standard doses of PPI  and are easily confused as having aecopd or asthma flares by even experienced allergists/ pulmonologists (myself included).   rec  First try off acei x one year to see if better results from just using otcs like zyrtec or allergra and if not and if allergy screen positive would consider formal allergy eval next.

## 2021-02-25 NOTE — Assessment & Plan Note (Signed)
Dates back to 1999 radiographically s antecedent hx of lung trauma/ infection or asbestos exp  - Labs ordered 02/25/2021  :  ESR. Quant TB   Since no ext scar most likely this was result of empyema in pt s access to abx as child as apparently present when arrived in [place] > no additional w/u needed

## 2021-02-25 NOTE — Telephone Encounter (Signed)
Called patient but he did not answer. I have sent the RX to the correct pharmacy.   Will await for him to call back.

## 2021-02-25 NOTE — Assessment & Plan Note (Addendum)
Trial off ACEi 02/25/2021 due to poorly controlled rhinitis/ cough   ACE inhibitors are problematic in  pts with airway complaints because  even experienced pulmonologists  can't always distinguish ace effects from asthma/ allergies.  By themselves they don't actually cause a problem, much like oxygen can't by itself start a fire, but they certainly serve as a powerful catalyst or enhancer for any "fire"  or inflammatory process in the upper airway, be it caused by a viral uri or allergic rhinitis mech, either of which may apply here.   In the era of ARB near equivalency until we have a better handle on the reversibility of the airway problem, it just makes sense to avoid ACEI  Entirely for the next year  and then decide later, having established a level of airway control using a reasonable limited regimen (just otc antihistamines in his case hopefully) , whether to add back ace but even then being very careful to observe the pt for worsening airway control and number of meds used/ needed to control symptoms.   >> try olmesartan 40 mg daily and f/u with PCP if respiratory symptoms improve and return here if recur and can't be controlled more easily with available otcs      I had an extended discussion with the patient and reviewed all relevant studies so total time was 45 minutes with moderate level of MDM.  Each maintenance medication was reviewed in detail including most importantly the difference between maintenance and prns and under what circumstances the prns are to be triggered using an action plan format that is not reflected in the computer generated alphabetically organized AVS.     Please see AVS for specific instructions unique to this visit that I personally wrote and verbalized to the the pt in detail and then reviewed with pt  by my nurse highlighting any  changes in therapy recommended at today's visit to their plan of care.

## 2021-02-26 NOTE — Telephone Encounter (Signed)
Called and spoke with patient. He is aware that the medication has been sent to the correct pharmacy.   Nothing further needed at time of call.

## 2021-03-01 ENCOUNTER — Other Ambulatory Visit: Payer: Self-pay

## 2021-03-01 ENCOUNTER — Ambulatory Visit (INDEPENDENT_AMBULATORY_CARE_PROVIDER_SITE_OTHER): Payer: Medicare Other | Admitting: Internal Medicine

## 2021-03-01 ENCOUNTER — Telehealth: Payer: Self-pay | Admitting: Internal Medicine

## 2021-03-01 ENCOUNTER — Encounter: Payer: Self-pay | Admitting: Internal Medicine

## 2021-03-01 VITALS — BP 135/88 | HR 74 | Temp 98.0°F | Wt 153.0 lb

## 2021-03-01 DIAGNOSIS — R053 Chronic cough: Secondary | ICD-10-CM | POA: Diagnosis not present

## 2021-03-01 DIAGNOSIS — I10 Essential (primary) hypertension: Secondary | ICD-10-CM

## 2021-03-01 DIAGNOSIS — E038 Other specified hypothyroidism: Secondary | ICD-10-CM

## 2021-03-01 LAB — IGE: IgE (Immunoglobulin E), Serum: 984 IU/mL — ABNORMAL HIGH (ref 6–495)

## 2021-03-01 NOTE — Telephone Encounter (Signed)
Benicar  was sent to [facility] on 02/25/2021. Patient is aware and voiced his understanding. Nothing further needed at this time.

## 2021-03-01 NOTE — Progress Notes (Signed)
BP 135/88   Pulse 74   Temp 98 F (36.7 C)   Wt 153 lb 0.3 oz (69.4 kg)   SpO2 99%   BMI 26.27 kg/m    Subjective:    Patient ID: Jerry Frey, male    DOB: 1951-01-10, 70 y.o.   MRN: [id number]  Chief Complaint  Patient presents with   Hypertension   Benign Prostatic Hypertrophy   Hypothyroidism    HPI: Jerry Frey is a 70 y.o. male  Pt is here for a follow up , had COVID and post COVID cough all better s/p allegra and medrol dose pak. Had hyper PRL levels and was seen by endocrinology for such.    Hypertension This is a chronic problem. The problem is controlled. Pertinent negatives include no anxiety, blurred vision, chest pain, headaches, malaise/fatigue, neck pain, orthopnea, palpitations, peripheral edema, PND or shortness of breath. Identifiable causes of hypertension include a thyroid problem.  Benign Prostatic Hypertrophy This is a chronic problem. Irritative symptoms do not include frequency or urgency. Pertinent negatives include no chills, dysuria, hematuria, nausea or vomiting.  Thyroid Problem Presents for follow-up visit. Patient reports no anxiety, cold intolerance, constipation, depressed mood, diaphoresis, diarrhea, dry skin, fatigue, hair loss, heat intolerance, hoarse voice, leg swelling, menstrual problem, nail problem, palpitations, tremors, visual change, weight gain or weight loss.   Chief Complaint  Patient presents with   Hypertension   Benign Prostatic Hypertrophy   Hypothyroidism    Relevant past medical, surgical, family and social history reviewed and updated as indicated. Interim medical history since our last visit reviewed. Allergies and medications reviewed and updated.  Review of Systems  Constitutional:  Negative for activity change, appetite change, chills, diaphoresis, fatigue, fever, malaise/fatigue, weight gain and weight loss.  HENT:  Negative for congestion, ear discharge, ear pain, facial swelling and hoarse voice.    Eyes:  Negative for blurred vision, pain, discharge and itching.  Respiratory:  Negative for cough, chest tightness, shortness of breath and wheezing.   Cardiovascular:  Negative for chest pain, palpitations, orthopnea, leg swelling and PND.  Gastrointestinal:  Negative for abdominal distention, abdominal pain, blood in stool, constipation, diarrhea, nausea and vomiting.  Endocrine: Negative for cold intolerance, heat intolerance, polydipsia, polyphagia and polyuria.  Genitourinary:  Negative for difficulty urinating, dysuria, flank pain, frequency, hematuria, menstrual problem and urgency.  Musculoskeletal:  Negative for arthralgias, gait problem, joint swelling, myalgias and neck pain.  Skin:  Negative for color change, rash and wound.  Neurological:  Negative for dizziness, tremors, speech difficulty, weakness, light-headedness, numbness and headaches.  Hematological:  Does not bruise/bleed easily.  Psychiatric/Behavioral:  Negative for agitation, confusion, decreased concentration, sleep disturbance and suicidal ideas. The patient is not nervous/anxious.    Per HPI unless specifically indicated above     Objective:    BP 135/88   Pulse 74   Temp 98 F (36.7 C)   Wt 153 lb 0.3 oz (69.4 kg)   SpO2 99%   BMI 26.27 kg/m   Wt Readings from Last 3 Encounters:  03/01/21 153 lb 0.3 oz (69.4 kg)  02/25/21 153 lb 3.2 oz (69.5 kg)  01/19/21 151 lb 3.2 oz (68.6 kg)    Physical Exam Vitals and nursing note reviewed.  Constitutional:      General: He is not in acute distress.    Appearance: Normal appearance. He is not ill-appearing or diaphoretic.  HENT:     Head: Normocephalic and atraumatic.     Right Ear: Tympanic membrane  and external ear normal. There is no impacted cerumen.     Left Ear: External ear normal.     Nose: No congestion or rhinorrhea.     Mouth/Throat:     Pharynx: No oropharyngeal exudate or posterior oropharyngeal erythema.  Eyes:     Conjunctiva/sclera:  Conjunctivae normal.     Pupils: Pupils are equal, round, and reactive to light.  Cardiovascular:     Rate and Rhythm: Normal rate and regular rhythm.     Heart sounds: No murmur heard.   No friction rub. No gallop.  Pulmonary:     Effort: No respiratory distress.     Breath sounds: No stridor. No wheezing or rhonchi.  Chest:     Chest wall: No tenderness.  Abdominal:     General: Abdomen is flat. Bowel sounds are normal.     Palpations: Abdomen is soft. There is no mass.     Tenderness: There is no abdominal tenderness.  Musculoskeletal:     Cervical back: Normal range of motion and neck supple. No rigidity or tenderness.     Left lower leg: No edema.  Skin:    General: Skin is warm and dry.  Neurological:     Mental Status: He is alert.    Results for orders placed or performed during the hospital encounter of [id number]  Basic metabolic panel  Result Value Ref Range   Sodium 138 135 - 145 mmol/L   Potassium 4.0 3.5 - 5.1 mmol/L   Chloride 102 98 - 111 mmol/L   CO2 25 22 - 32 mmol/L   Glucose, Bld 96 70 - 99 mg/dL   BUN 20 8 - 23 mg/dL   Creatinine, Ser 0.69 0.61 - 1.24 mg/dL   Calcium 9.2 8.9 - 10.3 mg/dL   GFR, Estimated >60 >60 mL/min   Anion gap 11 5 - 15  Sedimentation rate  Result Value Ref Range   Sed Rate 2 0 - 20 mm/hr  CBC with Differential/Platelet  Result Value Ref Range   WBC 5.6 4.0 - 10.5 K/uL   RBC 5.09 4.22 - 5.81 MIL/uL   Hemoglobin 15.1 13.0 - 17.0 g/dL   HCT 44.8 39.0 - 52.0 %   MCV 88.0 80.0 - 100.0 fL   MCH 29.7 26.0 - 34.0 pg   MCHC 33.7 30.0 - 36.0 g/dL   RDW 14.8 11.5 - 15.5 %   Platelets 168 150 - 400 K/uL   nRBC 0.0 0.0 - 0.2 %   Neutrophils Relative % 59 %   Neutro Abs 3.3 1.7 - 7.7 K/uL   Lymphocytes Relative 28 %   Lymphs Abs 1.6 0.7 - 4.0 K/uL   Monocytes Relative 8 %   Monocytes Absolute 0.5 0.1 - 1.0 K/uL   Eosinophils Relative 4 %   Eosinophils Absolute 0.2 0.0 - 0.5 K/uL   Basophils Relative 1 %   Basophils Absolute 0.0  0.0 - 0.1 K/uL   Immature Granulocytes 0 %   Abs Immature Granulocytes 0.01 0.00 - 0.07 K/uL        Current Outpatient Medications:    amLODipine (NORVASC) 5 MG tablet, Take 1 tablet (5 mg total) by mouth daily., Disp: 90 tablet, Rfl: 4   levothyroxine (SYNTHROID) 125 MCG tablet, Take 125 mcg by mouth daily., Disp: , Rfl:    olmesartan (BENICAR) 40 MG tablet, Take 1 tablet (40 mg total) by mouth daily., Disp: 30 tablet, Rfl: 11    Assessment & Plan:  HTN is on amlopdine  and olemsartan Chnaged from lisinopril is now on olmesartan. Continue current meds.  Medication compliance emphasised. pt advised to keep Bp logs. Pt verbalised understanding of the same. Pt to have a low salt diet . Exercise to reach a goal of at least 150 mins a week.  lifestyle modifications explained and pt understands importance of the above.  2. Hypothyroidism  Sees endocrinology Dr.Thomas O'Connell fu and mx per such  HYPOTHYROIDISM : is on 125 mcg synthroid for such  Pt is asymptomatic. PLEASE TAKE YOUR THYROID MEDICATION FIRST THING IN THE MORNING WHILST FASTING.  NO MEDICATION/ FOOD FOR AN HOUR AFTER INGESTING THYROID PILLS.    3. BPH stable, no meds.   4. Chronic cough - resolved.    Problem List Items Addressed This Visit   None    No orders of the defined types were placed in this encounter.    No orders of the defined types were placed in this encounter.    Follow up plan: No follow-ups on file.

## 2021-03-02 NOTE — Progress Notes (Signed)
Called pt, no answer. LMTCB

## 2021-03-03 LAB — QUANTIFERON-TB GOLD PLUS (RQFGPL)
QuantiFERON Mitogen Value: >10 IU/mL
QuantiFERON Nil Value: 0.03 IU/mL
QuantiFERON TB1 Ag Value: 0.01 IU/mL
QuantiFERON TB2 Ag Value: 0.02 IU/mL

## 2021-03-03 LAB — QUANTIFERON-TB GOLD PLUS: QuantiFERON-TB Gold Plus: NEGATIVE

## 2021-03-17 ENCOUNTER — Telehealth: Payer: Self-pay | Admitting: Internal Medicine

## 2021-03-17 DIAGNOSIS — I10 Essential (primary) hypertension: Secondary | ICD-10-CM

## 2021-03-17 NOTE — Telephone Encounter (Signed)
Called patient but he did not answer. Left message for him to call us back.  

## 2021-03-23 ENCOUNTER — Encounter: Payer: Self-pay | Admitting: *Deleted

## 2021-03-23 NOTE — Progress Notes (Signed)
Letter mailed to the pt asking him to call for results

## 2021-03-26 NOTE — Telephone Encounter (Signed)
Called patient but he did not answer. Left message for him to call us back.  

## 2021-03-30 NOTE — Telephone Encounter (Signed)
Patient is returning phone call. Patient phone number is [phone].

## 2021-03-30 NOTE — Telephone Encounter (Signed)
Lm for patient.  

## 2021-04-02 MED ORDER — OLMESARTAN MEDOXOMIL 40 MG PO TABS: 40.0000 mg | ORAL_TABLET | Freq: Every day | ORAL | 3 refills | Status: DC

## 2021-04-02 NOTE — Telephone Encounter (Signed)
Spoke with the pt He is requesting 90 day supply on Benicar  Rx was sent to pharm  Nothing further needed

## 2021-04-21 ENCOUNTER — Other Ambulatory Visit: Payer: Self-pay

## 2021-04-21 ENCOUNTER — Encounter: Payer: Self-pay | Admitting: Internal Medicine

## 2021-04-21 ENCOUNTER — Ambulatory Visit (INDEPENDENT_AMBULATORY_CARE_PROVIDER_SITE_OTHER): Payer: Medicare Other | Admitting: Internal Medicine

## 2021-04-21 VITALS — BP 130/86 | HR 71 | Temp 98.9°F

## 2021-04-21 DIAGNOSIS — Z23 Encounter for immunization: Secondary | ICD-10-CM | POA: Diagnosis not present

## 2021-05-31 ENCOUNTER — Ambulatory Visit: Payer: Medicare Other

## 2021-05-31 ENCOUNTER — Telehealth: Payer: Self-pay

## 2021-05-31 NOTE — Telephone Encounter (Signed)
This nurse attempted to call patient three times for telephonic AWV. Called at 0855, 0900, and 0910. Message left that we will call again to reschedule for another time.

## 2021-07-20 ENCOUNTER — Other Ambulatory Visit: Payer: Self-pay | Admitting: Nurse Practitioner

## 2021-07-20 DIAGNOSIS — I10 Essential (primary) hypertension: Secondary | ICD-10-CM

## 2021-07-20 NOTE — Telephone Encounter (Signed)
Requested Prescriptions  Pending Prescriptions Disp Refills   amLODipine (NORVASC) 5 MG tablet [Pharmacy Med Name: amLODIPine Besylate 5 MG Oral Tablet] 90 tablet 0    Sig: Take 1 tablet by mouth once daily     Cardiovascular:  Calcium Channel Blockers Passed - 07/20/2021  1:05 PM      Passed - Last BP in normal range    BP Readings from Last 1 Encounters:  04/21/21 130/86         Passed - Valid encounter within last 6 months    Recent Outpatient Visits          3 months ago Need for influenza vaccination   [place] Vigg, Avanti, MD   4 months ago Essential hypertension   [place] Vigg, Avanti, MD   6 months ago Uncomplicated asthma, unspecified asthma severity, unspecified whether persistent   [facility], Avanti, MD   1 year ago Hyperprolactinemia ([place])   [place], [place] T, NP   3 years ago Elevated glucose   [facility] [place], Jeannette How, MD

## 2021-09-03 ENCOUNTER — Telehealth: Payer: Self-pay | Admitting: Internal Medicine

## 2021-09-03 NOTE — Telephone Encounter (Signed)
Copied from [place] [id number]. Topic: Medicare AWV >> Sep 03, 2021 10:24 AM Lavonia Drafts wrote: Reason for CRM:  Left message for patient to call back and schedule the Medicare Annual Wellness Visit (AWV) virtually or by telephone.  Last AWV 05/29/20  Please schedule at anytime with [facility] Health Advisor.  45 minute appointment  Any questions, please call me at [phone]

## 2021-10-05 ENCOUNTER — Telehealth: Payer: Self-pay | Admitting: Internal Medicine

## 2021-10-05 NOTE — Telephone Encounter (Signed)
Copied from [place] [id number]. Topic: Medicare AWV ?>> Oct 05, 2021  2:22 PM Lavonia Drafts wrote: ?Reason for CRM:  ?Left message for patient to call back and schedule the Medicare Annual Wellness Visit (AWV) virtually or by telephone. ? ?Last AWV 05/29/20 ? ?Please schedule at anytime with [facility] Health Advisor. ? ?45 minute appointment ? ?Any questions, please call me at [phone] ?

## 2021-10-12 ENCOUNTER — Other Ambulatory Visit: Payer: Self-pay | Admitting: Internal Medicine

## 2021-10-12 DIAGNOSIS — I10 Essential (primary) hypertension: Secondary | ICD-10-CM

## 2021-10-12 NOTE — Telephone Encounter (Signed)
Requested medication (s) are due for refill today: yes ? ?Requested medication (s) are on the active medication list: yes ? ?Last refill:  07/20/22 #90 with 0 RF ? ?Future visit scheduled: no, has had No Shows and cancellations.  ? ?Notes to clinic:  Has already had a curtesy refill and there is no upcoming appointment scheduled. ? ? ? ?  ? ?Requested Prescriptions  ?Pending Prescriptions Disp Refills  ? amLODipine (NORVASC) 5 MG tablet [Pharmacy Med Name: amLODIPine Besylate 5 MG Oral Tablet] 90 tablet 0  ?  Sig: Take 1 tablet by mouth once daily  ?  ? Cardiovascular: Calcium Channel Blockers 2 Passed - 10/12/2021 10:57 AM  ?  ?  Passed - Last BP in normal range  ?  BP Readings from Last 1 Encounters:  ?04/21/21 130/86  ?  ?  ?  ?  Passed - Last Heart Rate in normal range  ?  Pulse Readings from Last 1 Encounters:  ?04/21/21 71  ?  ?  ?  ?  Passed - Valid encounter within last 6 months  ?  Recent Outpatient Visits   ? ?      ? 5 months ago Need for influenza vaccination  ? [facility] Vigg, Avanti, MD  ? 7 months ago Essential hypertension  ? [facility] Vigg, Avanti, MD  ? 8 months ago Uncomplicated asthma, unspecified asthma severity, unspecified whether persistent  ? [facility] Vigg, Avanti, MD  ? 1 year ago Hyperprolactinemia [facility])  ? [place], [place] T, NP  ? 3 years ago Elevated glucose  ? [facility] Crissman, Jeannette How, MD  ? ?  ?  ? ?  ?  ?  ? ? ?

## 2021-10-14 ENCOUNTER — Other Ambulatory Visit: Payer: Self-pay | Admitting: Internal Medicine

## 2021-10-14 DIAGNOSIS — E038 Other specified hypothyroidism: Secondary | ICD-10-CM | POA: Diagnosis not present

## 2021-10-14 DIAGNOSIS — E221 Hyperprolactinemia: Secondary | ICD-10-CM | POA: Diagnosis not present

## 2021-10-14 NOTE — Telephone Encounter (Signed)
Requested Prescriptions  ?Pending Prescriptions Disp Refills  ?? amLODipine (NORVASC) 5 MG tablet 90 tablet 0  ?  Sig: Take 1 tablet (5 mg total) by mouth daily.  ?  ? Cardiovascular: Calcium Channel Blockers 2 Passed - 10/14/2021 12:03 PM  ?  ?  Passed - Last BP in normal range  ?  BP Readings from Last 1 Encounters:  ?04/21/21 130/86  ?   ?  ?  Passed - Last Heart Rate in normal range  ?  Pulse Readings from Last 1 Encounters:  ?04/21/21 71  ?   ?  ?  Passed - Valid encounter within last 6 months  ?  Recent Outpatient Visits   ?      ? 5 months ago Need for influenza vaccination  ? [facility] Vigg, Avanti, MD  ? 7 months ago Essential hypertension  ? [facility] Vigg, Avanti, MD  ? 8 months ago Uncomplicated asthma, unspecified asthma severity, unspecified whether persistent  ? [facility] Vigg, Avanti, MD  ? 1 year ago Hyperprolactinemia [facility])  ? [place], [place] T, NP  ? 3 years ago Elevated glucose  ? [facility] Jeananne Rama, Jeannette How, MD  ?  ?  ?Future Appointments   ?        ? In 1 week Vigg, Avanti, MD [facility]  ?  ? ?  ?  ?  ?Refused Prescriptions Disp Refills  ?? amLODipine (NORVASC) 5 MG tablet [Pharmacy Med Name: amLODIPine Besylate 5 MG Oral Tablet] 90 tablet 0  ?  Sig: Take 1 tablet by mouth once daily  ?  ? Cardiovascular: Calcium Channel Blockers 2 Passed - 10/14/2021 12:03 PM  ?  ?  Passed - Last BP in normal range  ?  BP Readings from Last 1 Encounters:  ?04/21/21 130/86  ?   ?  ?  Passed - Last Heart Rate in normal range  ?  Pulse Readings from Last 1 Encounters:  ?04/21/21 71  ?   ?  ?  Passed - Valid encounter within last 6 months  ?  Recent Outpatient Visits   ?      ? 5 months ago Need for influenza vaccination  ? [facility] Vigg, Avanti, MD  ? 7 months ago Essential hypertension  ? [facility] Vigg, Avanti, MD  ? 8 months ago Uncomplicated asthma, unspecified asthma severity,  unspecified whether persistent  ? [facility] Vigg, Avanti, MD  ? 1 year ago Hyperprolactinemia [facility])  ? [place], [place] T, NP  ? 3 years ago Elevated glucose  ? [facility] Jeananne Rama, Jeannette How, MD  ?  ?  ?Future Appointments   ?        ? In 1 week Vigg, Avanti, MD [facility], [facility]  ?  ? ?  ?  ?  ? ?

## 2021-10-14 NOTE — Telephone Encounter (Signed)
Patient has appointment scheduled 10/21/21- courtesy RF #30 given ?Requested Prescriptions  ?Pending Prescriptions Disp Refills  ?? amLODipine (NORVASC) 5 MG tablet [Pharmacy Med Name: amLODIPine Besylate 5 MG Oral Tablet] 90 tablet 0  ?  Sig: Take 1 tablet by mouth once daily  ?  ? Cardiovascular: Calcium Channel Blockers 2 Passed - 10/14/2021 10:07 AM  ?  ?  Passed - Last BP in normal range  ?  BP Readings from Last 1 Encounters:  ?04/21/21 130/86  ?   ?  ?  Passed - Last Heart Rate in normal range  ?  Pulse Readings from Last 1 Encounters:  ?04/21/21 71  ?   ?  ?  Passed - Valid encounter within last 6 months  ?  Recent Outpatient Visits   ?      ? 5 months ago Need for influenza vaccination  ? [facility] Vigg, Avanti, MD  ? 7 months ago Essential hypertension  ? [facility] Vigg, Avanti, MD  ? 8 months ago Uncomplicated asthma, unspecified asthma severity, unspecified whether persistent  ? [facility] Vigg, Avanti, MD  ? 1 year ago Hyperprolactinemia [facility])  ? [place], [place] T, NP  ? 3 years ago Elevated glucose  ? [facility] Jeananne Rama, Jeannette How, MD  ?  ?  ?Future Appointments   ?        ? In 1 week Vigg, Avanti, MD [facility], [facility]  ?  ? ?  ?  ?  ? ?

## 2021-10-14 NOTE — Addendum Note (Signed)
Addended by: Elliot Cousin on: 10/14/2021 12:03 PM ? ? Modules accepted: Orders ? ?

## 2021-10-14 NOTE — Telephone Encounter (Signed)
Pt called to request refill for Amlodipine / pt scheduled appt for next Thursday / please advise / pt stated he needs Rx today and is out of medication  ?

## 2021-10-21 ENCOUNTER — Ambulatory Visit (INDEPENDENT_AMBULATORY_CARE_PROVIDER_SITE_OTHER): Payer: Medicare Other | Admitting: Internal Medicine

## 2021-10-21 ENCOUNTER — Encounter: Payer: Self-pay | Admitting: Internal Medicine

## 2021-10-21 ENCOUNTER — Other Ambulatory Visit: Payer: Self-pay

## 2021-10-21 VITALS — BP 142/99 | HR 92 | Temp 98.5°F | Ht 64.02 in | Wt 159.0 lb

## 2021-10-21 DIAGNOSIS — I10 Essential (primary) hypertension: Secondary | ICD-10-CM | POA: Insufficient documentation

## 2021-10-21 DIAGNOSIS — E039 Hypothyroidism, unspecified: Secondary | ICD-10-CM | POA: Insufficient documentation

## 2021-10-21 LAB — URINALYSIS, ROUTINE W REFLEX MICROSCOPIC
Bilirubin, UA: NEGATIVE
Glucose, UA: NEGATIVE
Ketones, UA: NEGATIVE
Leukocytes,UA: NEGATIVE
Nitrite, UA: NEGATIVE
Protein,UA: NEGATIVE
Specific Gravity, UA: 1.02 (ref 1.005–1.030)
Urobilinogen, Ur: 0.2 mg/dL (ref 0.2–1.0)
pH, UA: 5.5 (ref 5.0–7.5)

## 2021-10-21 MED ORDER — AMLODIPINE BESYLATE 5 MG PO TABS: 5.0000 mg | ORAL_TABLET | Freq: Every day | ORAL | 3 refills | Status: DC

## 2021-10-21 MED ORDER — LEVOTHYROXINE SODIUM 125 MCG PO TABS: 125.0000 ug | ORAL_TABLET | Freq: Every day | ORAL | 2 refills | Status: DC

## 2021-10-21 NOTE — Progress Notes (Signed)
? ?BP (!) 142/99   Pulse 92   Temp 98.5 ?F (36.9 ?C) (Oral)   Ht 5' 4.02" (1.626 m)   Wt 159 lb (72.1 kg)   SpO2 100%   BMI 27.28 kg/m?   ? ?Subjective:  ? ? Patient ID: Jerry Frey, male    DOB: 22-Dec-1950, 71 y.o.   MRN: [id number] ? ?No chief complaint on file. ? ? ?HPI: ?Cam Dauphin is a 71 y.o. male ? ?Hypertension ?This is a chronic (stable is on norvasc for such) problem. The current episode started more than 1 month ago. The problem has been waxing and waning since onset. The problem is controlled. Pertinent negatives include no anxiety, blurred vision, chest pain, headaches, malaise/fatigue, neck pain, orthopnea, palpitations, peripheral edema, PND, shortness of breath or sweats.  ? ?No chief complaint on file. ? ? ?Relevant past medical, surgical, family and social history reviewed and updated as indicated. Interim medical history since our last visit reviewed. ?Allergies and medications reviewed and updated. ? ?Review of Systems  ?Constitutional:  Negative for malaise/fatigue.  ?Eyes:  Negative for blurred vision.  ?Respiratory:  Negative for shortness of breath.   ?Cardiovascular:  Negative for chest pain, palpitations, orthopnea and PND.  ?Musculoskeletal:  Negative for neck pain.  ?Neurological:  Negative for headaches.  ? ?Per HPI unless specifically indicated above ? ?   ?Objective:  ?  ?BP (!) 142/99   Pulse 92   Temp 98.5 ?F (36.9 ?C) (Oral)   Ht 5' 4.02" (1.626 m)   Wt 159 lb (72.1 kg)   SpO2 100%   BMI 27.28 kg/m?   ?Wt Readings from Last 3 Encounters:  ?10/21/21 159 lb (72.1 kg)  ?03/01/21 153 lb 0.3 oz (69.4 kg)  ?02/25/21 153 lb 3.2 oz (69.5 kg)  ?  ?Physical Exam ?Vitals and nursing note reviewed.  ?Constitutional:   ?   General: He is not in acute distress. ?   Appearance: Normal appearance. He is not ill-appearing or diaphoretic.  ?HENT:  ?   Head: Normocephalic and atraumatic.  ?   Right Ear: Tympanic membrane and external ear normal. There is no impacted cerumen.   ?   Left Ear: External ear normal.  ?   Nose: No congestion or rhinorrhea.  ?   Mouth/Throat:  ?   Pharynx: No oropharyngeal exudate or posterior oropharyngeal erythema.  ?Eyes:  ?   Conjunctiva/sclera: Conjunctivae normal.  ?   Pupils: Pupils are equal, round, and reactive to light.  ?Cardiovascular:  ?   Rate and Rhythm: Normal rate and regular rhythm.  ?   Heart sounds: No murmur heard. ?  No friction rub. No gallop.  ?Pulmonary:  ?   Effort: No respiratory distress.  ?   Breath sounds: No stridor. No wheezing or rhonchi.  ?Chest:  ?   Chest wall: No tenderness.  ?Abdominal:  ?   General: Abdomen is flat. Bowel sounds are normal.  ?   Palpations: Abdomen is soft. There is no mass.  ?   Tenderness: There is no abdominal tenderness.  ?Musculoskeletal:  ?   Cervical back: Normal range of motion and neck supple. No rigidity or tenderness.  ?   Left lower leg: No edema.  ?Skin: ?   General: Skin is warm and dry.  ?Neurological:  ?   Mental Status: He is alert.  ? ? ?Results for orders placed or performed in visit on 10/21/21  ?CBC with Differential/Platelet  ?Result Value Ref Range  ? WBC  7.2 3.4 - 10.8 x10E3/uL  ? RBC 4.99 4.14 - 5.80 x10E6/uL  ? Hemoglobin 15.4 13.0 - 17.7 g/dL  ? Hematocrit 45.4 37.5 - 51.0 %  ? MCV 91 79 - 97 fL  ? MCH 30.9 26.6 - 33.0 pg  ? MCHC 33.9 31.5 - 35.7 g/dL  ? RDW 12.8 11.6 - 15.4 %  ? Platelets 165 150 - 450 x10E3/uL  ? Neutrophils 42 Not Estab. %  ? Lymphs 33 Not Estab. %  ? Monocytes 10 Not Estab. %  ? Eos 14 Not Estab. %  ? Basos 1 Not Estab. %  ? Neutrophils Absolute 3.0 1.4 - 7.0 x10E3/uL  ? Lymphocytes Absolute 2.4 0.7 - 3.1 x10E3/uL  ? Monocytes Absolute 0.7 0.1 - 0.9 x10E3/uL  ? EOS (ABSOLUTE) 1.0 (H) 0.0 - 0.4 x10E3/uL  ? Basophils Absolute 0.1 0.0 - 0.2 x10E3/uL  ? Immature Granulocytes 0 Not Estab. %  ? Immature Grans (Abs) 0.0 0.0 - 0.1 x10E3/uL  ?Comprehensive metabolic panel  ?Result Value Ref Range  ? Glucose 86 70 - 99 mg/dL  ? BUN 19 8 - 27 mg/dL  ? Creatinine, Ser  0.87 0.76 - 1.27 mg/dL  ? eGFR 92 >59 mL/min/1.73  ? BUN/Creatinine Ratio 22 10 - 24  ? Sodium 137 134 - 144 mmol/L  ? Potassium 4.2 3.5 - 5.2 mmol/L  ? Chloride 100 96 - 106 mmol/L  ? CO2 24 20 - 29 mmol/L  ? Calcium 9.9 8.6 - 10.2 mg/dL  ? Total Protein 7.1 6.0 - 8.5 g/dL  ? Albumin 4.6 3.7 - 4.7 g/dL  ? Globulin, Total 2.5 1.5 - 4.5 g/dL  ? Albumin/Globulin Ratio 1.8 1.2 - 2.2  ? Bilirubin Total 0.2 0.0 - 1.2 mg/dL  ? Alkaline Phosphatase 44 44 - 121 IU/L  ? AST 31 0 - 40 IU/L  ? ALT 34 0 - 44 IU/L  ?Urinalysis, Routine w reflex microscopic  ?Result Value Ref Range  ? Specific Gravity, UA 1.020 1.005 - 1.030  ? pH, UA 5.5 5.0 - 7.5  ? Color, UA Yellow Yellow  ? Appearance Ur Clear Clear  ? Leukocytes,UA Negative Negative  ? Protein,UA Negative Negative/Trace  ? Glucose, UA Negative Negative  ? Ketones, UA Negative Negative  ? RBC, UA Trace (A) Negative  ? Bilirubin, UA Negative Negative  ? Urobilinogen, Ur 0.2 0.2 - 1.0 mg/dL  ? Nitrite, UA Negative Negative  ?TSH  ?Result Value Ref Range  ? TSH 0.006 (L) 0.450 - 4.500 uIU/mL  ?Lipid panel  ?Result Value Ref Range  ? Cholesterol, Total 242 (H) 100 - 199 mg/dL  ? Triglycerides 236 (H) 0 - 149 mg/dL  ? HDL 64 >39 mg/dL  ? VLDL Cholesterol Cal 42 (H) 5 - 40 mg/dL  ? LDL Chol Calc (NIH) 136 (H) 0 - 99 mg/dL  ? Chol/HDL Ratio 3.8 0.0 - 5.0 ratio  ? ?   ? ? ?Current Outpatient Medications:  ?  olmesartan (BENICAR) 40 MG tablet, Take 1 tablet (40 mg total) by mouth daily., Disp: 90 tablet, Rfl: 3 ?  amLODipine (NORVASC) 5 MG tablet, Take 1 tablet (5 mg total) by mouth daily., Disp: 90 tablet, Rfl: 3 ?  fenofibrate (TRICOR) 145 MG tablet, Take 1 tablet (145 mg total) by mouth daily., Disp: 90 tablet, Rfl: 2 ?  levothyroxine (SYNTHROID) 100 MCG tablet, Take 1 tablet (100 mcg total) by mouth daily., Disp: 90 tablet, Rfl: 3  ? ? ?Assessment & Plan:  ?Hypothyroidism ?Chronic,  ongoing no recnet labs check TSH and adjust dose accordingly Continue current medication regimen and  adjust as needed.  Plan to recheck TSH at physical in 6 months. ?  ?PLEASE TAKE YOUR THYROID MEDICATION FIRST THING IN THE MORNING WHILST FASTING.  ?NO MEDICATION/ FOOD FOR AN HOUR AFTER INGESTING THYROID PILLS. ? ?2. HTN is on norvasc for such  ?HTN :  ?Continue current meds.  Medication compliance emphasised. pt advised to keep Bp logs. Pt verbalised understanding of the same. Pt to have a low salt diet . Exercise to reach a goal of at least 150 mins a week.  lifestyle modifications explained and pt understands importance of the above. ?Under good control on current regimen. Continue current regimen. Continue to monitor. Call with any concerns. Refills given. Labs drawn today. ? ?3.  ? ?Problem List Items Addressed This Visit   ? ?  ? Cardiovascular and Mediastinum  ? Primary hypertension - Primary  ? Relevant Medications  ? amLODipine (NORVASC) 5 MG tablet  ? Other Relevant Orders  ? CBC with Differential/Platelet (Completed)  ? Comprehensive metabolic panel (Completed)  ? Lipid panel  ? Urinalysis, Routine w reflex microscopic (Completed)  ? TSH (Completed)  ? Lipid panel (Completed)  ? Lipid panel  ? TSH  ? T4, free  ?  ? Endocrine  ? Hypothyroidism  ? Relevant Medications  ? amLODipine (NORVASC) 5 MG tablet  ? Other Relevant Orders  ? CBC with Differential/Platelet (Completed)  ? Comprehensive metabolic panel (Completed)  ? Lipid panel  ? Urinalysis, Routine w reflex microscopic (Completed)  ? TSH (Completed)  ? Lipid panel (Completed)  ? Lipid panel  ? TSH  ? T4, free  ?  ? ?Orders Placed This Encounter  ?Procedures  ? CBC with Differential/Platelet  ? Comprehensive metabolic panel  ? Lipid panel  ? Urinalysis, Routine w reflex microscopic  ? TSH  ? Lipid panel  ? Lipid panel  ? TSH  ? T4, free  ?  ? ?Meds ordered this encounter  ?Medications  ? amLODipine (NORVASC) 5 MG tablet  ?  Sig: Take 1 tablet (5 mg total) by mouth daily.  ?  Dispense:  90 tablet  ?  Refill:  3  ? DISCONTD: levothyroxine (SYNTHROID)  125 MCG tablet  ?  Sig: Take 1 tablet (125 mcg total) by mouth daily.  ?  Dispense:  90 tablet  ?  Refill:  2  ?  ? ?Follow up plan: ?Return in about 6 months (around 04/23/2022). ? ? ?

## 2021-10-22 ENCOUNTER — Other Ambulatory Visit: Payer: Self-pay | Admitting: Internal Medicine

## 2021-10-22 LAB — COMPREHENSIVE METABOLIC PANEL
ALT: 34 IU/L (ref 0–44)
AST: 31 IU/L (ref 0–40)
Albumin/Globulin Ratio: 1.8 (ref 1.2–2.2)
Albumin: 4.6 g/dL (ref 3.7–4.7)
Alkaline Phosphatase: 44 IU/L (ref 44–121)
BUN/Creatinine Ratio: 22 (ref 10–24)
BUN: 19 mg/dL (ref 8–27)
Bilirubin Total: 0.2 mg/dL (ref 0.0–1.2)
CO2: 24 mmol/L (ref 20–29)
Calcium: 9.9 mg/dL (ref 8.6–10.2)
Chloride: 100 mmol/L (ref 96–106)
Creatinine, Ser: 0.87 mg/dL (ref 0.76–1.27)
Globulin, Total: 2.5 g/dL (ref 1.5–4.5)
Glucose: 86 mg/dL (ref 70–99)
Potassium: 4.2 mmol/L (ref 3.5–5.2)
Sodium: 137 mmol/L (ref 134–144)
Total Protein: 7.1 g/dL (ref 6.0–8.5)
eGFR: 92 mL/min/{1.73_m2} (ref >59)

## 2021-10-22 LAB — CBC WITH DIFFERENTIAL/PLATELET
Basophils Absolute: 0.1 10*3/uL (ref 0.0–0.2)
Basos: 1 %
EOS (ABSOLUTE): 1 10*3/uL — ABNORMAL HIGH (ref 0.0–0.4)
Eos: 14 %
Hematocrit: 45.4 % (ref 37.5–51.0)
Hemoglobin: 15.4 g/dL (ref 13.0–17.7)
Immature Grans (Abs): 0 10*3/uL (ref 0.0–0.1)
Immature Granulocytes: 0 %
Lymphocytes Absolute: 2.4 10*3/uL (ref 0.7–3.1)
Lymphs: 33 %
MCH: 30.9 pg (ref 26.6–33.0)
MCHC: 33.9 g/dL (ref 31.5–35.7)
MCV: 91 fL (ref 79–97)
Monocytes Absolute: 0.7 10*3/uL (ref 0.1–0.9)
Monocytes: 10 %
Neutrophils Absolute: 3 10*3/uL (ref 1.4–7.0)
Neutrophils: 42 %
Platelets: 165 10*3/uL (ref 150–450)
RBC: 4.99 x10E6/uL (ref 4.14–5.80)
RDW: 12.8 % (ref 11.6–15.4)
WBC: 7.2 10*3/uL (ref 3.4–10.8)

## 2021-10-22 LAB — LIPID PANEL
Chol/HDL Ratio: 3.8 ratio (ref 0.0–5.0)
Cholesterol, Total: 242 mg/dL — ABNORMAL HIGH (ref 100–199)
HDL: 64 mg/dL (ref >39)
LDL Chol Calc (NIH): 136 mg/dL — ABNORMAL HIGH (ref 0–99)
Triglycerides: 236 mg/dL — ABNORMAL HIGH (ref 0–149)
VLDL Cholesterol Cal: 42 mg/dL — ABNORMAL HIGH (ref 5–40)

## 2021-10-22 LAB — TSH: TSH: 0.006 u[IU]/mL — ABNORMAL LOW (ref 0.450–4.500)

## 2021-10-22 MED ORDER — FENOFIBRATE 145 MG PO TABS: 145.0000 mg | ORAL_TABLET | Freq: Every day | ORAL | 2 refills | Status: DC

## 2021-10-22 MED ORDER — LEVOTHYROXINE SODIUM 100 MCG PO TABS: 100.0000 ug | ORAL_TABLET | Freq: Every day | ORAL | 3 refills | Status: DC

## 2021-10-22 NOTE — Progress Notes (Signed)
Pl le thim know I am reducing his synthroid to 100 needs to change diet work on diet-  low fat and high fiber diet - -sec to elevated TG need to add fenofibrate  ?recheck in 6 weeks thnx.  ?Please add Lipid profile and TSH with FT4 and fu with me then. Thnx.  ? ?

## 2021-11-15 ENCOUNTER — Ambulatory Visit: Payer: Self-pay

## 2021-11-15 NOTE — Telephone Encounter (Signed)
? ?  Chief Complaint: States 2 hours after taking Tricor, his heart starts "beating fast and feels irregular." Stopped taking it 3 days ago and has not had another episode. ?Symptoms: Above ?Frequency:  ?Pertinent Negatives: Patient denies  ?Disposition: '[]'$ ED /'[]'$ Urgent Care (no appt availability in office) / '[]'$ Appointment(In office/virtual)/ '[]'$  [facility]/ '[]'$ Home Care/ '[]'$ Refused Recommended Disposition /'[]'$ [facility]/ '[]'$  Follow-up with PCP ?Additional Notes: Please advise pt.  ?Answer Assessment - Initial Assessment Questions ?1. NAME of MEDICATION: "What medicine are you calling about?" ?    Tricor ?2. QUESTION: "What is your question?" (e.g., double dose of medicine, side effect) ?    Causing fast heart rate ?3. PRESCRIBING HCP: "Who prescribed it?" Reason: if prescribed by specialist, call should be referred to that group. ?    Dr. Neomia Dear ?4. SYMPTOMS: "Do you have any symptoms?" ?    Racing heart ?5. SEVERITY: If symptoms are present, ask "Are they mild, moderate or severe?" ?    Severe ?6. PREGNANCY:  "Is there any chance that you are pregnant?" "When was your last menstrual period?" ?    N/a ? ?Protocols used: Medication Question Call-A-AH ? ?

## 2021-11-15 NOTE — Telephone Encounter (Signed)
Noted needs a fu in 3 months with repeat labs thnx.

## 2021-11-15 NOTE — Telephone Encounter (Signed)
LMOM asking pt to call back to schedule appt ?

## 2021-11-15 NOTE — Telephone Encounter (Signed)
Pt stated he cant take his cholesterol medication / when he takes it it makes his heart beat irregular so he stopped taking it/ please advise  ? ? ?Left message to call back about symptoms. ?

## 2021-12-03 ENCOUNTER — Other Ambulatory Visit: Payer: Medicare Other

## 2022-01-05 ENCOUNTER — Telehealth: Payer: Self-pay | Admitting: Internal Medicine

## 2022-01-05 NOTE — Telephone Encounter (Signed)
Returned patient call. I informed him that if he can not take his cholesterol medication then he should make an appt to coming and be evaluated. Patient declined and said that the will be in at his appt in Sept.  He also wanted a new prescription for 125 mcg levothyroxine but I informed him that Dr. Neomia Dear had changed it to 100 mcg at his last visit, he said that his Endocrinologist said that the medication could not be changed and was told to take the 125 mcg. I informed him that if his Endocrinologist said that than he needs to have his Endocrinologist refill his 125 mcg levothyroxine. Patient verbalized understanding.

## 2022-01-05 NOTE — Telephone Encounter (Signed)
Pt stated he cant take the Cholesterol medicine that was prescribed due to making his heart rate abnormal when he takes it / pt also went to get Levothyroxine '125MG'$  / he was advise it was too soon to pick this up but he was also sent Levothyroxine '100mg'$  today that he did pick up / he will return the '100MG'$  to the pharmacy / he stated he rather have RX for '125MG'$  sent to the pharmacy / please advise / pharmacy has both '100MG'$  and '125MG'$  on file and asked if he is suppose to be taking both or if the '100MG'$  needs to be discontinued

## 2022-03-25 ENCOUNTER — Encounter: Payer: Self-pay | Admitting: Internal Medicine

## 2022-03-25 ENCOUNTER — Ambulatory Visit: Payer: Medicare Other | Admitting: Internal Medicine

## 2022-03-25 DIAGNOSIS — R053 Chronic cough: Secondary | ICD-10-CM

## 2022-03-25 DIAGNOSIS — I10 Essential (primary) hypertension: Secondary | ICD-10-CM

## 2022-03-25 MED ORDER — OLMESARTAN MEDOXOMIL 40 MG PO TABS: 40.0000 mg | ORAL_TABLET | Freq: Every day | ORAL | 0 refills | Status: DC

## 2022-03-25 NOTE — Assessment & Plan Note (Signed)
Trial off ACEi 02/25/2021 due to poorly controlled rhinitis/ cough > resolved   Although even in retrospect it may not be clear the ACEi contributed to the pt's symptoms,  Pt improved off them and adding them back at this point or in the future would risk confusion in interpretation of non-specific respiratory symptoms to which this patient is prone  ie  Better not to muddy the waters here.   >>> 90 days of benicar Rx but rec further refills per PCP          Each maintenance medication was reviewed in detail including emphasizing most importantly the difference between maintenance and prns and under what circumstances the prns are to be triggered using an action plan format where appropriate.  Total time for H and P, chart review, counseling,   and generating customized AVS unique to this office visit / same day charting = 25 min final summary eval

## 2022-03-25 NOTE — Progress Notes (Signed)
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    History of Present Illness  02/25/2021  Pulmonary/ 1st office eval/ Jerry Frey / [facility]  Chief Complaint  Patient presents with   Consult    pulmonary calcification ref Jerry Frey   Dyspnea:  very active but no heavy ex/ Not limited by breathing from desired activities   Cough: bad cough esp allergy season some better with zyrtec or allergra, recently completed steroids for flare p covid and  was on prn saba  but says was "sick from Feb 2022 to a few weeks prior to [name] " with cough/ wheeze/ nasal congestion Sleep: fine  SABA use: not using now  Rec Stop lisinopril 40 mg daily and replace with olmesartan 40 mg one day for a year to see if your allergies get a lot bette Please remember to go to the lab department    Allergy profile 02/25/2021 >  Eos 0.2 /  IgE  984 > refer to allergy if not improving off acei > did not refer       03/25/2022  f/u ov/Jerry Frey/ [facility] re: ? ACEi case   maint on off acei on prn zyrtec  and found pollen season this past year was much less severe, less need for zyrtec vs prior years Chief Complaint  Patient presents with   Follow-up  Dyspnea:  Not limited by breathing from desired activities  / no aerobics though  Cough: gone  Sleeping: fine flat  SABA use: none  02: none  Covid status:  vax / never infected    No obvious day to day or daytime variability or assoc excess/ purulent sputum or mucus plugs or hemoptysis or cp or chest tightness, subjective wheeze or overt sinus or hb symptoms.   sleeping without nocturnal  or early am exacerbation  of respiratory  c/o's or need for noct saba. Also denies any obvious  fluctuation of symptoms with weather or environmental changes or other aggravating or alleviating factors except as outlined above   No unusual exposure hx or h/o childhood pna/ asthma or knowledge of premature birth.  Current Allergies, Complete Past Medical History, Past Surgical History, Family History, and Social History were reviewed in [employer] record.  ROS  The following are not active complaints unless bolded Hoarseness, sore throat, dysphagia, dental problems, itching, sneezing,  nasal congestion or discharge of excess mucus or purulent secretions, ear ache,   fever, chills, sweats, unintended wt loss or wt gain, classically pleuritic or exertional cp,  orthopnea pnd or arm/hand swelling  or leg swelling, presyncope, palpitations, abdominal pain, anorexia, nausea, vomiting, diarrhea  or change in bowel habits or change in bladder habits, change in stools or change in urine, dysuria, hematuria,  rash, arthralgias, visual complaints, headache, numbness, weakness or ataxia or problems with walking or coordination,  change in mood or  memory.        Current Meds  Medication Sig   amLODipine (NORVASC) 5 MG tablet Take 1 tablet (5 mg total) by mouth daily.   fenofibrate (TRICOR) 145 MG tablet Take 1 tablet (145 mg total) by mouth daily.   levothyroxine (SYNTHROID) 100  MCG tablet Take 1 tablet (100 mcg total) by mouth daily.   olmesartan (BENICAR) 40 MG tablet Take 1 tablet (40 mg total) by mouth daily.           Objective:     03/25/2022        156   10/21/21 159 lb (72.1 kg)  03/01/21 153 lb 0.3 oz (69.4 kg)  02/25/21 153 lb 3.2 oz (69.5 kg)      Vital signs reviewed  03/25/2022  - Note at rest 02 sats  97% on RA   General appearance:   amb pleasant laotian male nad      HEENT : Oropharynx  clear      Nasal turbinates nl    NECK :  without  apparent JVD/ palpable Nodes/TM    LUNGS: no acc muscle use,  Nl contour chest which is clear to A and P  bilaterally without cough on insp or exp maneuvers   CV:  RRR  no s3 or murmur or increase in P2, and no edema   ABD:  soft and nontender with nl inspiratory excursion in the supine position. No bruits or organomegaly appreciated   MS:  Nl gait/ ext warm without deformities Or obvious joint restrictions  calf tenderness, cyanosis or clubbing    SKIN: warm and dry without lesions    NEURO:  alert, approp, nl sensorium with  no motor or cerebellar deficits apparent.             Assessment                  02/25/2021

## 2022-03-25 NOTE — Assessment & Plan Note (Addendum)
Seasonal pattern assoc with rhinits x years but lasts feb to June  - Allergy profile 02/25/2021 >  Eos 0.2 /  IgE  984 > refer to allergy if not improving off acei > did not request referral and symptoms resolved with minimal zyrtec prn/ off acei   This suggests allergy was just the trigger and acei the "acclerant" in the pattern of cough he was experiencing no further pulmonary w/u needed   Adequate control on present rx, reviewed in detail with pt > no change in rx needed  > f/u allergy eval  prn but as long as so responsive to zyrtec no need for formal eval by allergy

## 2022-03-25 NOTE — Patient Instructions (Signed)
If you are satisfied with your treatment plan,  let your doctor know and he/she can either refill your medications or you can return here when your prescription runs out.     If in any way you are not 100% satisfied,  please tell us.  If 100% better, tell your friends!  Pulmonary follow up is as needed   

## 2022-04-25 ENCOUNTER — Ambulatory Visit: Payer: Medicare Other | Admitting: Internal Medicine

## 2022-04-26 ENCOUNTER — Ambulatory Visit: Payer: Medicare Other | Admitting: Physician Assistant

## 2022-05-03 ENCOUNTER — Ambulatory Visit (INDEPENDENT_AMBULATORY_CARE_PROVIDER_SITE_OTHER): Payer: Medicare Other | Admitting: Physician Assistant

## 2022-05-03 ENCOUNTER — Encounter: Payer: Self-pay | Admitting: Physician Assistant

## 2022-05-03 ENCOUNTER — Ambulatory Visit: Payer: Self-pay | Admitting: *Deleted

## 2022-05-03 VITALS — BP 115/78 | HR 89 | Temp 98.3°F | Ht 64.02 in | Wt 156.6 lb

## 2022-05-03 DIAGNOSIS — E221 Hyperprolactinemia: Secondary | ICD-10-CM | POA: Diagnosis not present

## 2022-05-03 DIAGNOSIS — Z131 Encounter for screening for diabetes mellitus: Secondary | ICD-10-CM | POA: Diagnosis not present

## 2022-05-03 DIAGNOSIS — E038 Other specified hypothyroidism: Secondary | ICD-10-CM

## 2022-05-03 DIAGNOSIS — Z125 Encounter for screening for malignant neoplasm of prostate: Secondary | ICD-10-CM

## 2022-05-03 DIAGNOSIS — I10 Essential (primary) hypertension: Secondary | ICD-10-CM

## 2022-05-03 DIAGNOSIS — E782 Mixed hyperlipidemia: Secondary | ICD-10-CM | POA: Diagnosis not present

## 2022-05-03 MED ORDER — ROSUVASTATIN CALCIUM 5 MG PO TABS: 5.0000 mg | ORAL_TABLET | Freq: Every day | ORAL | 1 refills | Status: DC

## 2022-05-03 NOTE — Patient Instructions (Addendum)
I would like to start you on a statin for your cholesterol  Please take this once per day at night time  We will recheck your labs in 3 months to make sure your cholesterol is improving  To help with your heartburn I recommend taking a preventive medication like Zantac or Prilosec  Also make note of foods that cause your heartburn and avoid them  I have included information about heartburn management with your paperwork  I would like you to come back for your labs - these need to be done fasting which means nothing to eat or drink for at least 8 hours before you get them done.

## 2022-05-03 NOTE — Telephone Encounter (Signed)
Summary: Medication Management   Pt stated he was given a medication list at a recent appointment, and a medication on that list is not right. Pt stated medication, levothyroxine (SYNTHROID), 100 MCG tablet should be 125 MG. I advised pt on the directions it says -Take 125 mcg by mouth daily.   Asked how he takes medication, pt stated he takes one by mouth daily, but it should be a 125 MG tablet.    Pt seeking clarification.     Per [facility] chart notes: 10/13/20 dose was 125 mcg 10/22/21 dose was reduced 100 mcg (see lab notes) Per Endo notes: 10/26/21- Per TO, Yes.  Tell him to stay on his current dose and not to let his pcp change the dose.  Tell him he should tell his pcp to read my note in the computer which will explain to his pcp why he can't rely on the TSH test that he checked. 01/05/22- office notified by patient and advised have Endo prescribe thyroid medication  OV today notes patient is followed by Endo for hypothyroid- dosing 125 mcg  Patient is requesting the dosage be updated in his chart to dose he is taking. Levothyroxine 125 mcg  Attempted to call patient- left message to call office-will forward request to office Reason for Disposition  [1] Caller has NON-URGENT medicine question about med that PCP prescribed AND [2] triager unable to answer question  Answer Assessment - Initial Assessment Questions 1. NAME of MEDICINE: "What medicine(s) are you calling about?"     Levothyroxine  2. QUESTION: "What is your question?" (e.g., double dose of medicine, side effect)     Would like correct dosing listed on medication list 3. PRESCRIBER: "Who prescribed the medicine?" Reason: if prescribed by specialist, call should be referred to that group. Endo  Protocols used: Medication Question Call-A-AH

## 2022-05-03 NOTE — Progress Notes (Unsigned)
Established Patient Office Visit  Name: Jerry Frey   MRN: [id number]    DOB: [birth year]   Date:05/04/2022  Today's Provider: Talitha Givens, MHS, PA-C Introduced myself to the patient as a PA-C and provided education on APPs in clinical practice.         Subjective  Chief Complaint  Chief Complaint  Patient presents with   Hypertension   Hypothyroidism    Hypertension Pertinent negatives include no blurred vision, chest pain, headaches, malaise/fatigue, palpitations or shortness of breath.   Reports he had heartburn that started happening about 3 months ago Seems to be triggered by spicy foods, pepper, garlic  He has taken OTC medications- TUMS, Pepto, but reports limited relief  HYPERTENSION / [place] Satisfied with current treatment? yes Duration of hypertension: years BP monitoring frequency: a few times a week BP range: 125-130/80-90 BP medication side effects: no Past BP meds: amlodipine and olmesartan (benicar) Duration of hyperlipidemia: chronic Cholesterol medication side effects: no Cholesterol supplements: none Past cholesterol medications: fenofibrate (tricor)- did not take for more than a few days due to heart palpitations  Medication compliance: poor compliance Aspirin: no Recent stressors: no Recurrent headaches: no Visual changes: no Palpitations: no Dyspnea: no Chest pain: no Lower extremity edema: no Dizzy/lightheaded: no  The 10-year ASCVD risk score (Arnett DK, et al., 2019) is: 19.7%   Values used to calculate the score:     Age: 71 years     Sex: Male     Is Non-Hispanic African American: No     Diabetic: No     Tobacco smoker: No     Systolic Blood Pressure: 197 mmHg     Is BP treated: Yes     HDL Cholesterol: 64 mg/dL     Total Cholesterol: 242 mg/dL    HYPOTHYROIDISM Thyroid control status:stable Satisfied with current treatment? yes He is followed by Endocrinology who is prescribing 125 mcg of Levothyroxine   Medication side effects: no Medication compliance: good compliance Etiology of hypothyroidism: central hypothyroidism  Recent dose adjustment:no Fatigue: yes Cold intolerance: no Heat intolerance: no Weight gain: no Weight loss: no Constipation: no Diarrhea/loose stools: no Palpitations: no Lower extremity edema: no Anxiety/depressed mood: no   Patient Active Problem List   Diagnosis Date Noted   Mixed hyperlipidemia 05/04/2022   Pleural calcification on R  02/25/2021   Chronic cough 02/25/2021   History of hepatitis B 06/05/2020   Hyperprolactinemia ([place]) 05/31/2018   Elevated glucose 05/30/2018   History of tuberculosis 06/08/2017   Hemorrhoid prolapse 05/09/2017   Central hypothyroidism 03/28/2017   ED (erectile dysfunction) of non-organic origin 04/30/2015   BPH (benign prostatic hyperplasia) 04/30/2015   Essential hypertension     Past Surgical History:  Procedure Laterality Date   COLONOSCOPY WITH PROPOFOL N/A 09/07/2020   Procedure: COLONOSCOPY WITH PROPOFOL;  Surgeon: Lin Landsman, MD;  Location: [facility] ENDOSCOPY;  Service: Gastroenterology;  Laterality: N/A;   L4 compression fracture      Family History  Family history unknown: Yes    Social History   Tobacco Use   Smoking status: Former    Packs/day: 1.00    Years: 20.00    Total pack years: 20.00    Types: Cigarettes    Quit date: 08/01/1986    Years since quitting: 35.7   Smokeless tobacco: Never  Substance Use Topics   Alcohol use: Not Currently    Alcohol/week: 7.0 standard drinks of alcohol    Types:  7 Glasses of wine per week    Comment: per week     Current Outpatient Medications:    amLODipine (NORVASC) 5 MG tablet, Take 1 tablet (5 mg total) by mouth daily., Disp: 90 tablet, Rfl: 3   levothyroxine (SYNTHROID) 100 MCG tablet, Take 1 tablet (100 mcg total) by mouth daily. (Patient taking differently: Take 125 mcg by mouth daily.), Disp: 90 tablet, Rfl: 3   olmesartan (BENICAR) 40 MG  tablet, Take 1 tablet (40 mg total) by mouth daily., Disp: 90 tablet, Rfl: 0   rosuvastatin (CRESTOR) 5 MG tablet, Take 1 tablet (5 mg total) by mouth daily., Disp: 90 tablet, Rfl: 1  No Known Allergies  I personally reviewed active problem list, medication list, allergies, notes from last encounter, notes from last 4 encounters, lab results with the patient/caregiver today.   Review of Systems  Constitutional:  Negative for chills, fever and malaise/fatigue.  Eyes:  Negative for blurred vision and double vision.  Respiratory:  Negative for shortness of breath.   Cardiovascular:  Negative for chest pain, palpitations and leg swelling.  Gastrointestinal:  Negative for constipation, diarrhea, nausea and vomiting.  Musculoskeletal:  Negative for joint pain and myalgias.  Neurological:  Negative for dizziness, tingling and headaches.  Psychiatric/Behavioral:  Negative for depression. The patient is not nervous/anxious and does not have insomnia.       Objective  Vitals:   05/03/22 1401  BP: 115/78  Pulse: 89  Temp: 98.3 F (36.8 C)  TempSrc: Oral  SpO2: 98%  Weight: 156 lb 9.6 oz (71 kg)  Height: 5' 4.02" (1.626 m)    Body mass index is 26.87 kg/m.  Physical Exam Vitals reviewed.  Constitutional:      Appearance: Normal appearance.  HENT:     Head: Normocephalic and atraumatic.     Nose: Nose normal.  Eyes:     Extraocular Movements: Extraocular movements intact.     Conjunctiva/sclera: Conjunctivae normal.     Pupils: Pupils are equal, round, and reactive to light.  Pulmonary:     Effort: Pulmonary effort is normal.  Musculoskeletal:        General: Normal range of motion.     Cervical back: Normal range of motion.  Neurological:     General: No focal deficit present.     Mental Status: He is alert and oriented to person, place, and time.  Psychiatric:        Mood and Affect: Mood normal.        Behavior: Behavior normal.        Thought Content: Thought  content normal.        Judgment: Judgment normal.      No results found for this or any previous visit (from the past 2160 hour(s)).   PHQ2/9:    05/03/2022    2:05 PM 10/21/2021    3:30 PM 01/19/2021    1:10 PM 05/29/2020    2:33 PM 05/27/2019    9:43 AM  Depression screen PHQ 2/9  Decreased Interest 0 0 0 0 0  Down, Depressed, Hopeless 0 0 0 0 0  PHQ - 2 Score 0 0 0 0 0  Altered sleeping 0 0  0   Tired, decreased energy 0 0  0   Change in appetite 0 0  0   Feeling bad or failure about yourself  0 0  0   Trouble concentrating 0 0  0   Moving slowly or fidgety/restless 0 0  0  Suicidal thoughts 0 0  0   PHQ-9 Score 0 0  0   Difficult doing work/chores Not difficult at all Not difficult at all  Not difficult at all       Fall Risk:    05/03/2022    2:05 PM 10/21/2021    3:30 PM 01/19/2021    1:09 PM 05/29/2020    2:33 PM 05/27/2019    9:43 AM  Fall Risk   Falls in the past year? 0 0 0 0 0  Number falls in past yr: 0 0 0  0  Injury with Fall? 0 0 0  0  Risk for fall due to : No Fall Risks No Fall Risks No Fall Risks Medication side effect   Follow up Falls evaluation completed Falls evaluation completed Falls evaluation completed Falls evaluation completed;Education provided;Falls prevention discussed       Functional Status Survey:      Assessment & Plan  Problem List Items Addressed This Visit       Cardiovascular and Mediastinum   Essential hypertension    Chronic, ongoing, appears well controlled at this time He is taking OLmesartan 40 mg PO QD and amlodipine 5 mg PO QD  Appears to be tolerating this regimen well Continue current medications  Will add statin for cholesterol management  Follow up in 3 months for monitoring       Relevant Medications   rosuvastatin (CRESTOR) 5 MG tablet   Other Relevant Orders   CBC w/Diff   Comp Met (CMET)     Endocrine   Central hypothyroidism    Chronic, historic condition, ongoing and appears stable He is  followed regularly by Endocrinology  Taking Levothyroxine 125 mcg daily and appears to be tolerating well Reviewed notes from Endo - patient is to stay on 125 mcg dose, will defer to Endo for management Recommend rechecking Thyroid panel regularly for monitoring  Follow up in 3 months for monitoring and surveillance        Relevant Orders   Comp Met (CMET)   Thyroid Profile([facility])   Hyperprolactinemia (H[facility])    Chronic, ongoing He sees Endocrinology for management and appears to be doing well Reviewed most recent Endo notes- will continue collaboration with them and defer to their management strategy Follow up as needed         Other   Mixed hyperlipidemia - Primary    Chronic, ongoing Review of most recent cholesterol levels indicates mixed hyperlipidemia. He was prescribed Fenofibrate but review of patient messages reveals he was unable to tolerate this  Will replace with Rosuvastatin 5 mg PO QHS and recheck cholesterol in 3 months to monitor progress. Discussed potential side effects and benefits of this medication with patient Follow up in 3 months or sooner if concerns arise.       Relevant Medications   rosuvastatin (CRESTOR) 5 MG tablet   Other Visit Diagnoses     Screening PSA (prostate specific antigen)       Relevant Orders   PSA   Screening for diabetes mellitus (DM)       Relevant Orders   HgB A1c        Return in about 3 months (around 08/03/2022) for HTN, HLD, hypothyroidism.   I, Ellard Nan E Chay Mazzoni, PA-C, have reviewed all documentation for this visit. The documentation on 05/04/22 for the exam, diagnosis, procedures, and orders are all accurate and complete.   Talitha Givens, MHS, PA-C [facility]

## 2022-05-04 ENCOUNTER — Telehealth: Payer: Self-pay | Admitting: Physician Assistant

## 2022-05-04 DIAGNOSIS — E782 Mixed hyperlipidemia: Secondary | ICD-10-CM | POA: Insufficient documentation

## 2022-05-04 NOTE — Telephone Encounter (Signed)
Pt states he sees a dr at [facility] for his thyroid.  He still adamant he takes 125 mg synthroid. He just wants to make sure you have that information correct in his chart.  But the other dr prescribes for him.

## 2022-05-04 NOTE — Telephone Encounter (Signed)
Chart was updated by [facility] during appt.

## 2022-05-04 NOTE — Assessment & Plan Note (Signed)
Chronic, ongoing, appears well controlled at this time He is taking OLmesartan 40 mg PO QD and amlodipine 5 mg PO QD  Appears to be tolerating this regimen well Continue current medications  Will add statin for cholesterol management  Follow up in 3 months for monitoring

## 2022-05-04 NOTE — Assessment & Plan Note (Signed)
Chronic, ongoing He sees Endocrinology for management and appears to be doing well Reviewed most recent Endo notes- will continue collaboration with them and defer to their management strategy Follow up as needed

## 2022-05-04 NOTE — Assessment & Plan Note (Signed)
Chronic, ongoing Review of most recent cholesterol levels indicates mixed hyperlipidemia. He was prescribed Fenofibrate but review of patient messages reveals he was unable to tolerate this  Will replace with Rosuvastatin 5 mg PO QHS and recheck cholesterol in 3 months to monitor progress. Discussed potential side effects and benefits of this medication with patient Follow up in 3 months or sooner if concerns arise.

## 2022-05-04 NOTE — Assessment & Plan Note (Signed)
Chronic, historic condition, ongoing and appears stable He is followed regularly by Endocrinology  Taking Levothyroxine 125 mcg daily and appears to be tolerating well Reviewed notes from Endo - patient is to stay on 125 mcg dose, will defer to Endo for management Recommend rechecking Thyroid panel regularly for monitoring  Follow up in 3 months for monitoring and surveillance

## 2022-06-01 ENCOUNTER — Ambulatory Visit: Payer: Medicare Other

## 2022-06-08 ENCOUNTER — Ambulatory Visit: Payer: Medicare Other

## 2022-06-09 ENCOUNTER — Ambulatory Visit (INDEPENDENT_AMBULATORY_CARE_PROVIDER_SITE_OTHER): Payer: Medicare Other

## 2022-06-09 DIAGNOSIS — Z23 Encounter for immunization: Secondary | ICD-10-CM

## 2022-06-19 ENCOUNTER — Other Ambulatory Visit: Payer: Self-pay | Admitting: Internal Medicine

## 2022-06-19 DIAGNOSIS — I10 Essential (primary) hypertension: Secondary | ICD-10-CM

## 2022-07-12 ENCOUNTER — Other Ambulatory Visit: Payer: Self-pay | Admitting: Internal Medicine

## 2022-07-12 DIAGNOSIS — I10 Essential (primary) hypertension: Secondary | ICD-10-CM

## 2022-07-20 ENCOUNTER — Other Ambulatory Visit: Payer: Self-pay

## 2022-07-22 MED ORDER — LEVOTHYROXINE SODIUM 125 MCG PO TABS: 125.0000 ug | ORAL_TABLET | Freq: Every day | ORAL | 0 refills | Status: DC

## 2022-07-22 NOTE — Telephone Encounter (Signed)
Pt scheduled  

## 2022-07-22 NOTE — Telephone Encounter (Signed)
HAs not had blood work done since March and it was abnormal. Needs blood work for refill

## 2022-07-26 IMAGING — US US ABDOMEN LIMITED
1 series · 14 of 25 positions shown · non-contrast
Comparison: 06/20/2017

CLINICAL DATA: Chronic hepatitis-B

EXAM:
ULTRASOUND ABDOMEN LIMITED RIGHT UPPER QUADRANT

[Series 1: us abdomen limited ruq (liver/gb) · 14 of 48 slices shown]
[im 1/48]
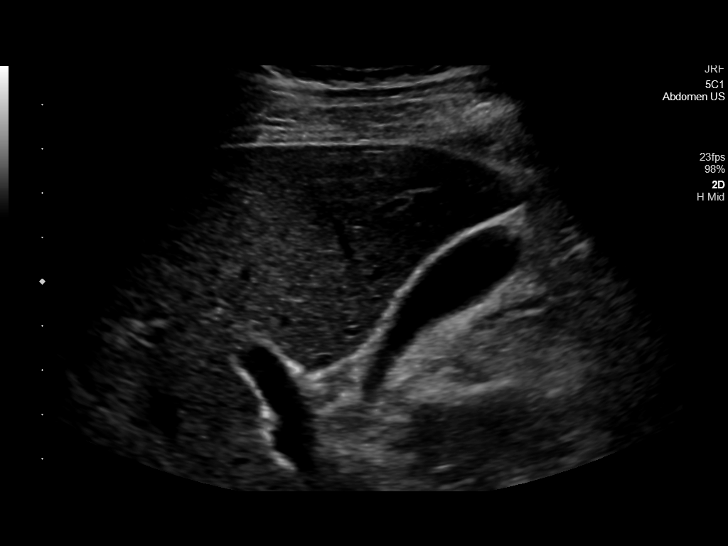
[im 4/48]
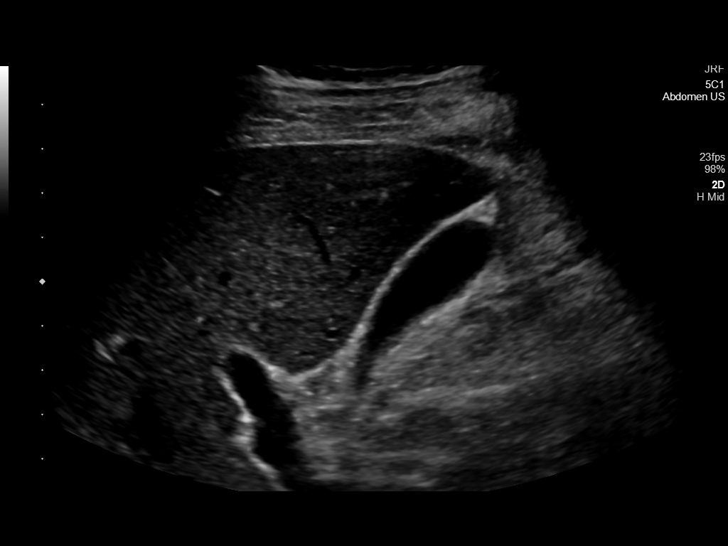
[im 8/48]
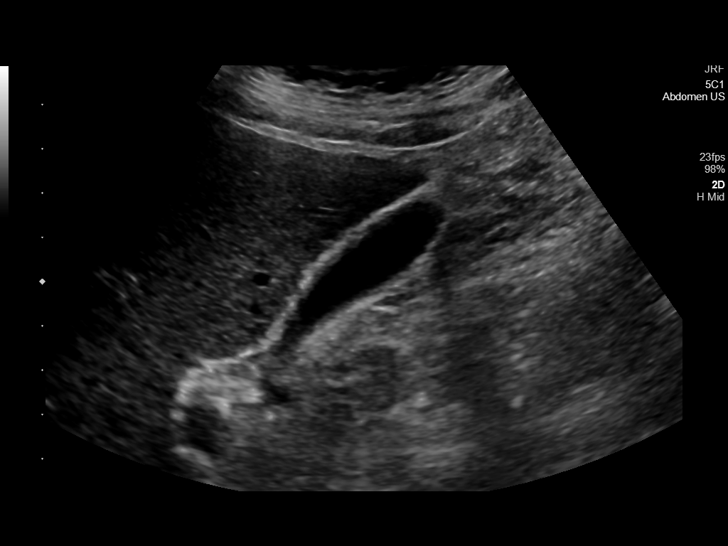
[im 12/48]
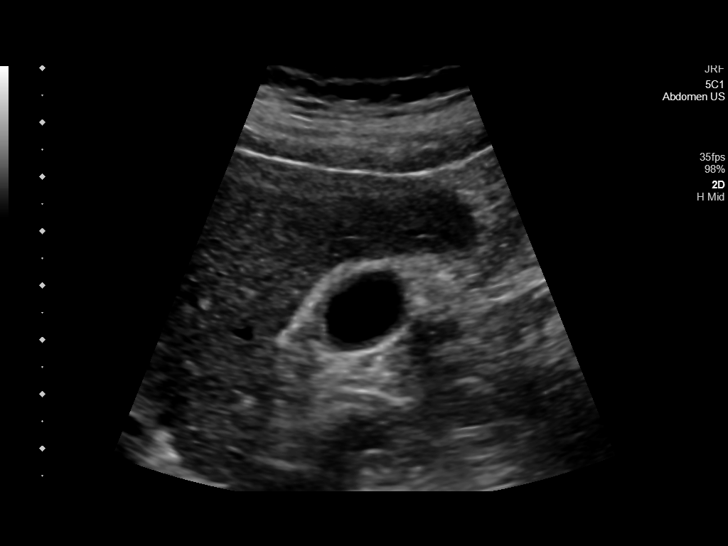
[im 16/48]
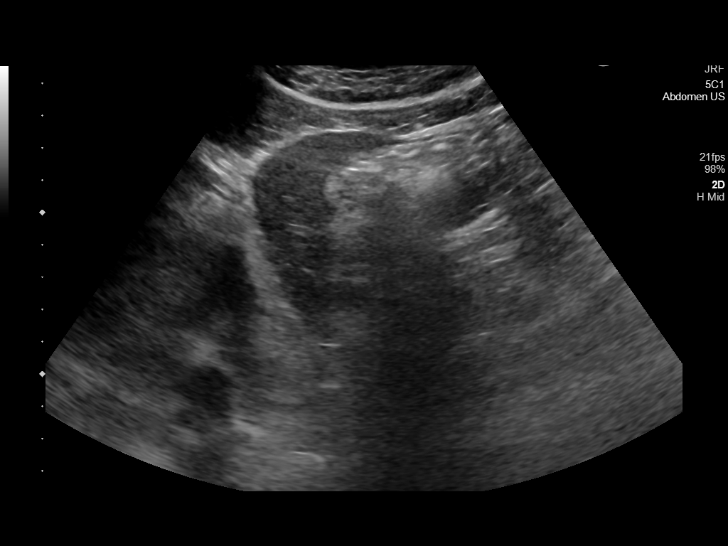
[im 18/48]
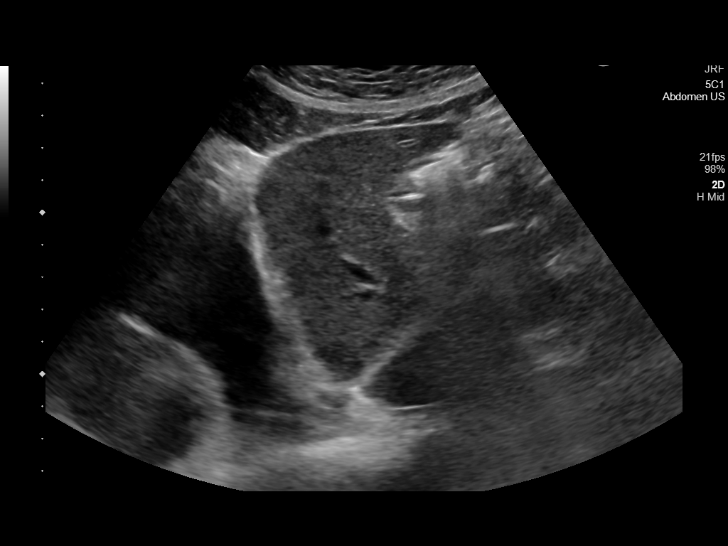
[im 22/48]
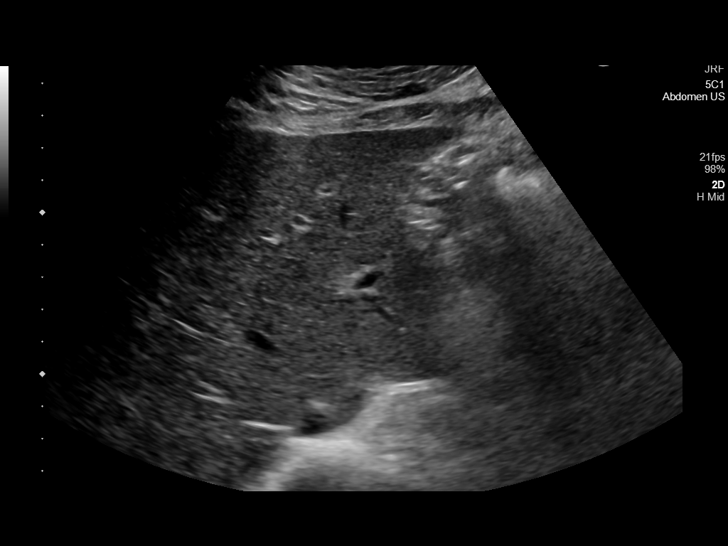
[im 26/48]
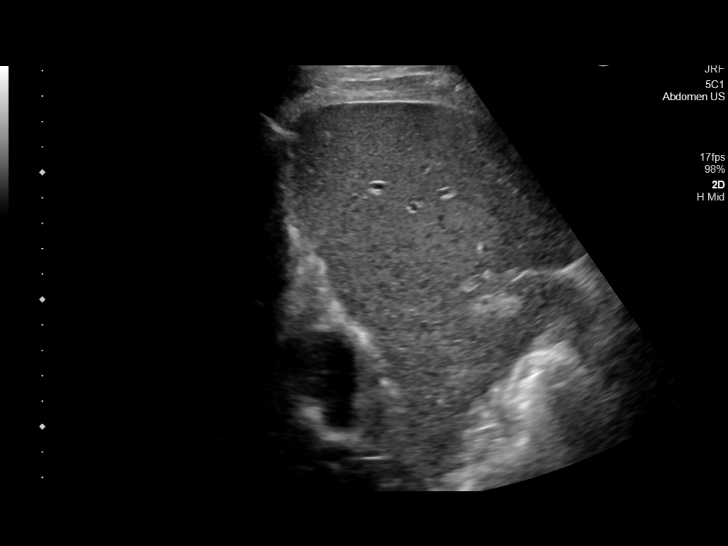
[im 30/48]
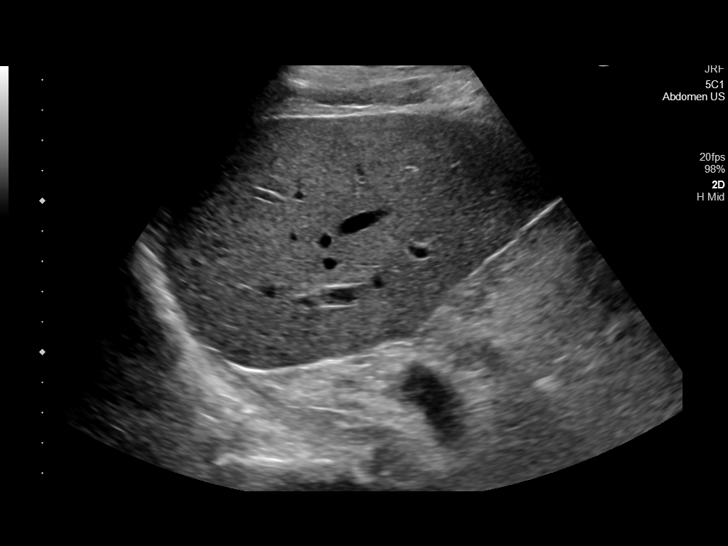
[im 32/48]
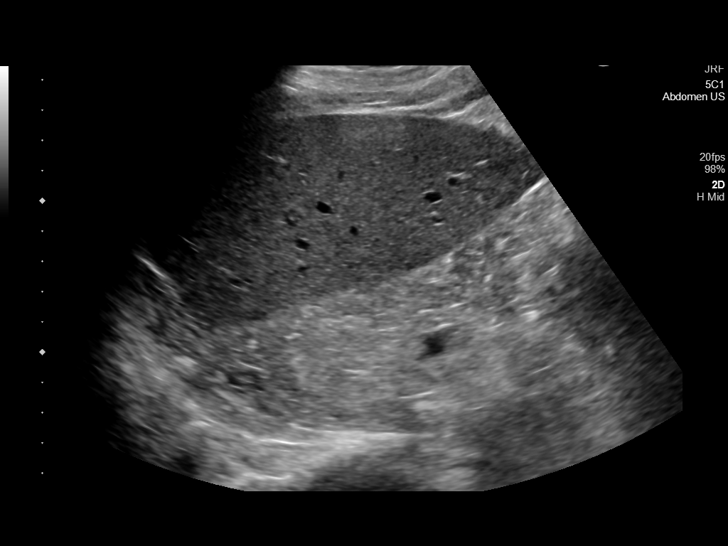
[im 36/48]
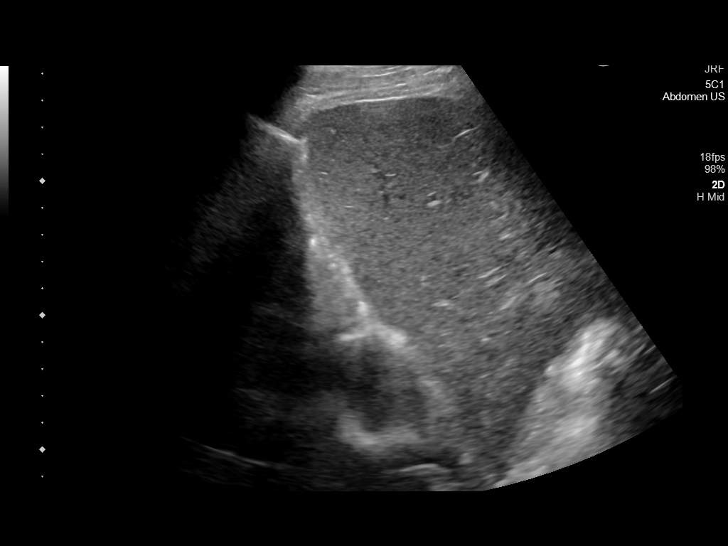
[im 40/48]
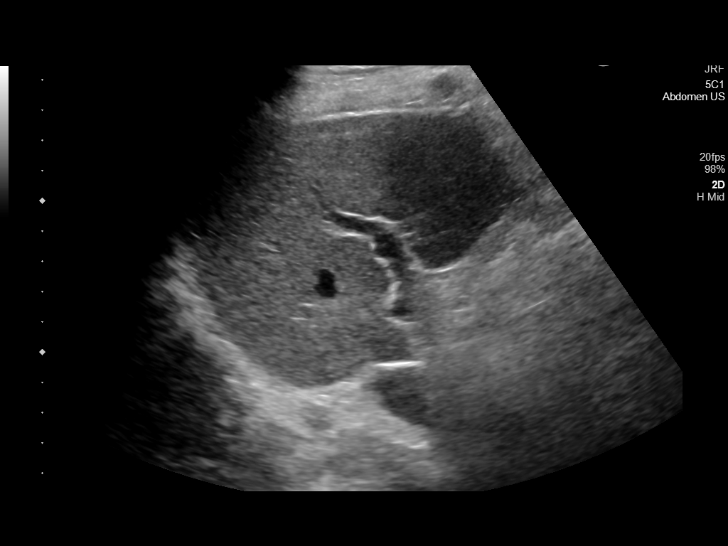
[im 44/48]
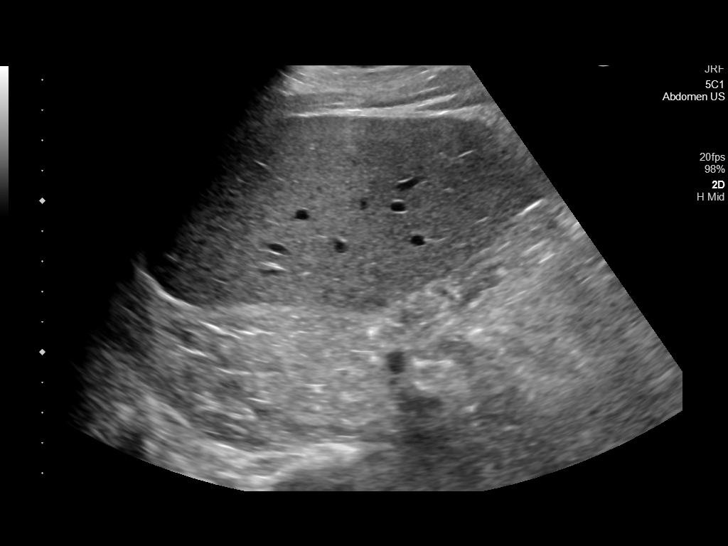
[im 48/48]
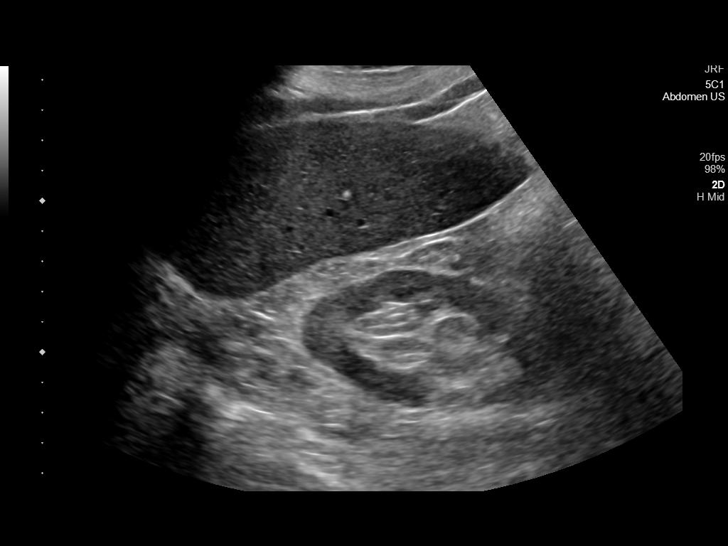

[14 of 25 positions shown; findings below may reference images not displayed]

FINDINGS: Gallbladder:

No gallstones or wall thickening visualized. No sonographic Murphy
sign noted by sonographer.

Common bile duct:

Diameter: 3 mm in proximal diameter

Liver:

No focal lesion identified. Within normal limits in parenchymal
echogenicity. Portal vein is patent on color Doppler imaging with
normal direction of blood flow towards the liver.

Other: No ascites
IMPRESSION: Normal examination.  No focal intrahepatic mass identified.

## 2022-08-02 ENCOUNTER — Ambulatory Visit (INDEPENDENT_AMBULATORY_CARE_PROVIDER_SITE_OTHER): Payer: No Typology Code available for payment source | Admitting: Family Medicine

## 2022-08-02 ENCOUNTER — Encounter: Payer: Self-pay | Admitting: Family Medicine

## 2022-08-02 VITALS — BP 123/79 | HR 101 | Ht 64.02 in | Wt 159.0 lb

## 2022-08-02 DIAGNOSIS — Z87891 Personal history of nicotine dependence: Secondary | ICD-10-CM

## 2022-08-02 DIAGNOSIS — B181 Chronic viral hepatitis B without delta-agent: Secondary | ICD-10-CM

## 2022-08-02 DIAGNOSIS — N400 Enlarged prostate without lower urinary tract symptoms: Secondary | ICD-10-CM

## 2022-08-02 DIAGNOSIS — E782 Mixed hyperlipidemia: Secondary | ICD-10-CM | POA: Diagnosis not present

## 2022-08-02 DIAGNOSIS — E221 Hyperprolactinemia: Secondary | ICD-10-CM

## 2022-08-02 DIAGNOSIS — E038 Other specified hypothyroidism: Secondary | ICD-10-CM

## 2022-08-02 DIAGNOSIS — I10 Essential (primary) hypertension: Secondary | ICD-10-CM

## 2022-08-02 DIAGNOSIS — Z0000 Encounter for general adult medical examination without abnormal findings: Secondary | ICD-10-CM | POA: Diagnosis not present

## 2022-08-02 LAB — URINALYSIS, ROUTINE W REFLEX MICROSCOPIC
Bilirubin, UA: NEGATIVE
Glucose, UA: NEGATIVE
Ketones, UA: NEGATIVE
Leukocytes,UA: NEGATIVE
Nitrite, UA: NEGATIVE
Protein,UA: NEGATIVE
Specific Gravity, UA: 1.015 (ref 1.005–1.030)
Urobilinogen, Ur: 0.2 mg/dL (ref 0.2–1.0)
pH, UA: 7 (ref 5.0–7.5)

## 2022-08-02 LAB — MICROSCOPIC EXAMINATION
Bacteria, UA: NONE SEEN
Epithelial Cells (non renal): NONE SEEN /hpf (ref 0–10)

## 2022-08-02 LAB — MICROALBUMIN, URINE WAIVED
Creatinine, Urine Waived: 100 mg/dL (ref 10–300)
Microalb, Ur Waived: 30 mg/L — ABNORMAL HIGH (ref 0–19)
Microalb/Creat Ratio: <30 mg/g (ref <30)

## 2022-08-02 MED ORDER — OLMESARTAN MEDOXOMIL 40 MG PO TABS: 40.0000 mg | ORAL_TABLET | Freq: Every day | ORAL | 1 refills | Status: DC

## 2022-08-02 MED ORDER — ROSUVASTATIN CALCIUM 5 MG PO TABS: 5.0000 mg | ORAL_TABLET | Freq: Every day | ORAL | 1 refills | Status: DC

## 2022-08-02 MED ORDER — AMLODIPINE BESYLATE 5 MG PO TABS: 5.0000 mg | ORAL_TABLET | Freq: Every day | ORAL | 1 refills | Status: DC

## 2022-08-02 NOTE — Assessment & Plan Note (Signed)
Under good control on current regimen. Continue current regimen. Continue to monitor. Call with any concerns. Refills given. Labs drawn today.   

## 2022-08-02 NOTE — Progress Notes (Signed)
BP 123/79   Pulse (!) 101   Ht 5' 4.02" (1.626 m)   Wt 159 lb (72.1 kg)   SpO2 96%   BMI 27.28 kg/m    Subjective:    Patient ID: Jerry Frey, male    DOB: 09/14/1950, 72 y.o.   MRN: [id number]  HPI: Stacy Deshler is a 72 y.o. male presenting on 08/02/2022 for comprehensive medical examination. Current medical complaints include:  HYPERTENSION / HYPERLIPIDEMIA Satisfied with current treatment? yes Duration of hypertension: chronic BP monitoring frequency: not checking BP medication side effects: no Past BP meds: olmesartan and amlodipine Duration of hyperlipidemia: chronic Cholesterol medication side effects: no Cholesterol supplements: none Past cholesterol medications: crestor Medication compliance: excellent compliance Aspirin: no Recent stressors: no Recurrent headaches: no Visual changes: no Palpitations: no Dyspnea: no Chest pain: no Lower extremity edema: no Dizzy/lightheaded: no  HYPOTHYROIDISM Thyroid control status:controlled Satisfied with current treatment? yes Medication side effects: no Medication compliance: excellent compliance Recent dose adjustment:no Fatigue: no Cold intolerance: no Heat intolerance: no Weight gain: no Weight loss: no Constipation: no Diarrhea/loose stools: no Palpitations: no Lower extremity edema: no Anxiety/depressed mood: no  Interim Problems from his last visit: no  Functional Status Survey: Is the patient deaf or have difficulty hearing?: No Does the patient have difficulty seeing, even when wearing glasses/contacts?: No Does the patient have difficulty concentrating, remembering, or making decisions?: No Does the patient have difficulty walking or climbing stairs?: No Does the patient have difficulty dressing or bathing?: No Does the patient have difficulty doing errands alone such as visiting a doctor's office or shopping?: No  FALL RISK:    08/02/2022    1:46 PM 05/03/2022    2:05 PM 10/21/2021     3:30 PM 01/19/2021    1:09 PM 05/29/2020    2:33 PM  [place]aine in the past year? 0 0 0 0 0  Number falls in past yr: 0 0 0 0   Injury with Fall? 0 0 0 0   Risk for fall due to : No Fall Risks No Fall Risks No Fall Risks No Fall Risks Medication side effect  Follow up Falls evaluation completed Falls evaluation completed Falls evaluation completed Falls evaluation completed Falls evaluation completed;Education provided;Falls prevention discussed    Depression Screen    08/02/2022    1:45 PM 05/03/2022    2:05 PM 10/21/2021    3:30 PM 01/19/2021    1:10 PM 05/29/2020    2:33 PM  Depression screen PHQ 2/9  Decreased Interest 0 0 0 0 0  Down, Depressed, Hopeless 0 0 0 0 0  PHQ - 2 Score 0 0 0 0 0  Altered sleeping 0 0 0  0  Tired, decreased energy 0 0 0  0  Change in appetite 0 0 0  0  Feeling bad or failure about yourself  0 0 0  0  Trouble concentrating 0 0 0  0  Moving slowly or fidgety/restless 0 0 0  0  Suicidal thoughts 0 0 0  0  PHQ-9 Score 0 0 0  0  Difficult doing work/chores Not difficult at all Not difficult at all Not difficult at all  Not difficult at all   Advanced Directives Does not have one  Past Medical History:  Past Medical History:  Diagnosis Date   Hypertension    Thyroid disease     Surgical History:  Past Surgical History:  Procedure Laterality Date   COLONOSCOPY WITH PROPOFOL  N/A 09/07/2020   Procedure: COLONOSCOPY WITH PROPOFOL;  Surgeon: Lin Landsman, MD;  Location: [facility] ENDOSCOPY;  Service: Gastroenterology;  Laterality: N/A;   L4 compression fracture      Medications:  Current Outpatient Medications on File Prior to Visit  Medication Sig   levothyroxine (SYNTHROID) 125 MCG tablet Take 1 tablet (125 mcg total) by mouth daily.   No current facility-administered medications on file prior to visit.    Allergies:  No Known Allergies  Social History:  Social History   Socioeconomic History   Marital status: Married    Spouse  name: Not on file   Number of children: Not on file   Years of education: Not on file   Highest education level: 12th grade  Occupational History   Occupation: retired  Tobacco Use   Smoking status: Former    Packs/day: 1.00    Years: 20.00    Total pack years: 20.00    Types: Cigarettes    Quit date: 08/01/1986    Years since quitting: 36.0   Smokeless tobacco: Never  Vaping Use   Vaping Use: Never used  Substance and Sexual Activity   Alcohol use: Not Currently    Alcohol/week: 7.0 standard drinks of alcohol    Types: 7 Glasses of wine per week    Comment: per week   Drug use: No   Sexual activity: Not on file  Other Topics Concern   Not on file  Social History Narrative   Not on file   Social Determinants of Health   Financial Resource Strain: Low Risk  (05/29/2020)   Overall Financial Resource Strain (CARDIA)    Difficulty of Paying Living Expenses: Not hard at all  Food Insecurity: No Food Insecurity (05/29/2020)   Hunger Vital Sign    Worried About Running Out of Food in the Last Year: Never true    Ran Out of Food in the Last Year: Never true  Transportation Needs: No Transportation Needs (05/29/2020)   PRAPARE - Hydrologist (Medical): No    Lack of Transportation (Non-Medical): No  Physical Activity: Inactive (05/29/2020)   Exercise Vital Sign    Days of Exercise per Week: 0 days    Minutes of Exercise per Session: 0 min  Stress: No Stress Concern Present (05/29/2020)   [place]    Feeling of Stress : Not at all  Social Connections: Moderately Isolated (05/23/2018)   Social Connection and Isolation Panel [NHANES]    Frequency of Communication with Friends and Family: Once a week    Frequency of Social Gatherings with Friends and Family: Once a week    Attends Religious Services: Never    Marine scientist or Organizations: No    Attends Theatre manager Meetings: Never    Marital Status: Married  Human resources officer Violence: Not At Risk (05/23/2018)   Humiliation, Afraid, Rape, and Kick questionnaire    Fear of Current or Ex-Partner: No    Emotionally Abused: No    Physically Abused: No    Sexually Abused: No   Social History   Tobacco Use  Smoking Status Former   Packs/day: 1.00   Years: 20.00   Total pack years: 20.00   Types: Cigarettes   Quit date: 08/01/1986   Years since quitting: 36.0  Smokeless Tobacco Never   Social History   Substance and Sexual Activity  Alcohol Use Not Currently   Alcohol/week: 7.0  standard drinks of alcohol   Types: 7 Glasses of wine per week   Comment: per week    Family History:  Family History  Family history unknown: Yes    Past medical history, surgical history, medications, allergies, family history and social history reviewed with patient today and changes made to appropriate areas of the chart.   Review of Systems  Constitutional: Negative.   HENT: Negative.    Eyes: Negative.   Respiratory: Negative.    Cardiovascular: Negative.   Gastrointestinal:  Positive for heartburn. Negative for abdominal pain, blood in stool, constipation, diarrhea, melena, nausea and vomiting.  Genitourinary: Negative.        Nocturia and hesitancy  Musculoskeletal: Negative.   Skin: Negative.   Neurological: Negative.   Endo/Heme/Allergies:  Positive for environmental allergies. Negative for polydipsia. Does not bruise/bleed easily.  Psychiatric/Behavioral: Negative.     All other ROS negative except what is listed above and in the HPI.      Objective:    BP 123/79   Pulse (!) 101   Ht 5' 4.02" (1.626 m)   Wt 159 lb (72.1 kg)   SpO2 96%   BMI 27.28 kg/m   Wt Readings from Last 3 Encounters:  08/02/22 159 lb (72.1 kg)  05/03/22 156 lb 9.6 oz (71 kg)  03/25/22 156 lb (70.8 kg)    Physical Exam Vitals and nursing note reviewed.  Constitutional:      General: He is not in  acute distress.    Appearance: Normal appearance. He is normal weight. He is not ill-appearing, toxic-appearing or diaphoretic.  HENT:     Head: Normocephalic and atraumatic.     Right Ear: Tympanic membrane, ear canal and external ear normal. There is no impacted cerumen.     Left Ear: Tympanic membrane, ear canal and external ear normal. There is no impacted cerumen.     Nose: Nose normal. No congestion or rhinorrhea.     Mouth/Throat:     Mouth: Mucous membranes are moist.     Pharynx: Oropharynx is clear. No oropharyngeal exudate or posterior oropharyngeal erythema.  Eyes:     General: No scleral icterus.       Right eye: No discharge.        Left eye: No discharge.     Extraocular Movements: Extraocular movements intact.     Conjunctiva/sclera: Conjunctivae normal.     Pupils: Pupils are equal, round, and reactive to light.  Neck:     Vascular: No carotid bruit.  Cardiovascular:     Rate and Rhythm: Normal rate and regular rhythm.     Pulses: Normal pulses.     Heart sounds: No murmur heard.    No friction rub. No gallop.  Pulmonary:     Effort: Pulmonary effort is normal. No respiratory distress.     Breath sounds: Normal breath sounds. No stridor. No wheezing, rhonchi or rales.  Chest:     Chest wall: No tenderness.  Abdominal:     General: Abdomen is flat. Bowel sounds are normal. There is no distension.     Palpations: Abdomen is soft. There is no mass.     Tenderness: There is no abdominal tenderness. There is no right CVA tenderness, left CVA tenderness, guarding or rebound.     Hernia: No hernia is present.  Genitourinary:    Comments: Genital exam deferred with shared decision making Musculoskeletal:        General: No swelling, tenderness, deformity or signs of injury.  Cervical back: Normal range of motion and neck supple. No rigidity. No muscular tenderness.     Right lower leg: No edema.     Left lower leg: No edema.  Lymphadenopathy:     Cervical: No  cervical adenopathy.  Skin:    General: Skin is warm and dry.     Capillary Refill: Capillary refill takes less than 2 seconds.     Coloration: Skin is not jaundiced or pale.     Findings: No bruising, erythema, lesion or rash.  Neurological:     General: No focal deficit present.     Mental Status: He is alert and oriented to person, place, and time.     Cranial Nerves: No cranial nerve deficit.     Sensory: No sensory deficit.     Motor: No weakness.     Coordination: Coordination normal.     Gait: Gait normal.     Deep Tendon Reflexes: Reflexes normal.  Psychiatric:        Mood and Affect: Mood normal.        Behavior: Behavior normal.        Thought Content: Thought content normal.        Judgment: Judgment normal.        08/02/2022    2:02 PM 05/29/2020    2:36 PM 05/23/2018    9:55 AM 05/03/2017    3:23 PM  6CIT Screen  What Year? 0 points 0 points 0 points 0 points  What month? 0 points 0 points 0 points 0 points  What time? 0 points 0 points 0 points 0 points  Count back from 20 0 points 2 points 0 points 0 points  Months in reverse 0 points 0 points 0 points 0 points  Repeat phrase 4 points 2 points 6 points 8 points  Total Score 4 points 4 points 6 points 8 points    Results for orders placed or performed in visit on 08/02/22  Microscopic Examination   Urine  Result Value Ref Range   WBC, UA 0-5 0 - 5 /hpf   RBC, Urine 0-2 0 - 2 /hpf   Epithelial Cells (non renal) None seen 0 - 10 /hpf   Bacteria, UA None seen None seen/Few  Urinalysis, Routine w reflex microscopic  Result Value Ref Range   Specific Gravity, UA 1.015 1.005 - 1.030   pH, UA 7.0 5.0 - 7.5   Color, UA Yellow Yellow   Appearance Ur Clear Clear   Leukocytes,UA Negative Negative   Protein,UA Negative Negative/Trace   Glucose, UA Negative Negative   Ketones, UA Negative Negative   RBC, UA Trace (A) Negative   Bilirubin, UA Negative Negative   Urobilinogen, Ur 0.2 0.2 - 1.0 mg/dL   Nitrite,  UA Negative Negative   Microscopic Examination See below:   Microalbumin, Urine Waived  Result Value Ref Range   Microalb, Ur Waived 30 (H) 0 - 19 mg/L   Creatinine, Urine Waived 100 10 - 300 mg/dL   Microalb/Creat Ratio <30 <30 mg/g      Assessment & Plan:   Problem List Items Addressed This Visit       Cardiovascular and Mediastinum   Essential hypertension    Under good control on current regimen. Continue current regimen. Continue to monitor. Call with any concerns. Refills given. Labs drawn today.       Relevant Medications   amLODipine (NORVASC) 5 MG tablet   olmesartan (BENICAR) 40 MG tablet   rosuvastatin (CRESTOR)  5 MG tablet   Other Relevant Orders   Comprehensive metabolic panel   CBC with Differential/Platelet   Urinalysis, Routine w reflex microscopic (Completed)   Microalbumin, Urine Waived (Completed)     Digestive   Chronic hepatitis B ([place])    Was to have liver bx in 2022 prior to hep B treatment. Never had this done. Discussed getting him back into GI. He is not interested at this time.         Endocrine   Central hypothyroidism    Rechecking labs today. Await results. Treat as needed. Continue to follow with endocrinology.       Relevant Orders   Comprehensive metabolic panel   CBC with Differential/Platelet   TSH   Hyperprolactinemia ([place])    Rechecking labs today. Await results. Treat as needed. Continue to follow with endocrinology.       Relevant Orders   Comprehensive metabolic panel   CBC with Differential/Platelet   Prolactin     Genitourinary   BPH (benign prostatic hyperplasia)   Relevant Orders   Comprehensive metabolic panel   CBC with Differential/Platelet   PSA     Other   Mixed hyperlipidemia    Under good control on current regimen. Continue current regimen. Continue to monitor. Call with any concerns. Refills given. Labs drawn today.       Relevant Medications   amLODipine (NORVASC) 5 MG tablet   olmesartan  (BENICAR) 40 MG tablet   rosuvastatin (CRESTOR) 5 MG tablet   Other Relevant Orders   Comprehensive metabolic panel   CBC with Differential/Platelet   Lipid Panel w/o Chol/HDL Ratio   Other Visit Diagnoses     Encounter for annual wellness exam in Medicare patient    -  Primary   Preventative care discussed today as below.   Routine general medical examination at a health care facility       Vaccines up to date. Screening labs checked today. Colonoscopy up to date. Continue diet and exercise. Call with any concerns.       Preventative Services:  Health Risk Assessment and Personalized Prevention Plan: Done today Bone Mass Measurements: N/A CVD Screening: Done today Colon Cancer Screening: Up to date Depression Screening: Done today Diabetes Screening: Done today Glaucoma Screening: See your eye doctor Hepatitis B vaccine: N/A Hepatitis C screening: Up to date HIV Screening: up to date Flu Vaccine: up to date Lung cancer Screening: N/A Obesity Screening: Done today Pneumonia Vaccines (2): up to date STI Screening: N/A PSA screening:Done today  LABORATORY TESTING:  Health maintenance labs ordered today as discussed above.   The natural history of prostate cancer and ongoing controversy regarding screening and potential treatment outcomes of prostate cancer has been discussed with the patient. The meaning of a false positive PSA and a false negative PSA has been discussed. He indicates understanding of the limitations of this screening test and wishes  to proceed with screening PSA testing.   IMMUNIZATIONS:   - Tdap: Tetanus vaccination status reviewed: last tetanus booster within 10 years. - Influenza: Up to date - Pneumovax: Up to date - Prevnar: Up to date - Zostavax vaccine: Given elsewhere  SCREENING: - Colonoscopy: Up to date  Discussed with patient purpose of the colonoscopy is to detect colon cancer at curable precancerous or early stages    PATIENT  COUNSELING:    Sexuality: Discussed sexually transmitted diseases, partner selection, use of condoms, avoidance of unintended pregnancy  and contraceptive alternatives.   Advised to avoid  cigarette smoking.  I discussed with the patient that most people either abstain from alcohol or drink within safe limits (<=14/week and <=4 drinks/occasion for males, <=7/weeks and <= 3 drinks/occasion for females) and that the risk for alcohol disorders and other health effects rises proportionally with the number of drinks per week and how often a drinker exceeds daily limits.  Discussed cessation/primary prevention of drug use and availability of treatment for abuse.   Diet: Encouraged to adjust caloric intake to maintain  or achieve ideal body weight, to reduce intake of dietary saturated fat and total fat, to limit sodium intake by avoiding high sodium foods and not adding table salt, and to maintain adequate dietary potassium and calcium preferably from fresh fruits, vegetables, and low-fat dairy products.    stressed the importance of regular exercise  Injury prevention: Discussed safety belts, safety helmets, smoke detector, smoking near bedding or upholstery.   Dental health: Discussed importance of regular tooth brushing, flossing, and dental visits.   Follow up plan: NEXT PREVENTATIVE PHYSICAL DUE IN 1 YEAR. Return in about 6 months (around 01/31/2023).

## 2022-08-02 NOTE — Assessment & Plan Note (Signed)
Rechecking labs today. Await results. Treat as needed. Continue to follow with endocrinology.

## 2022-08-02 NOTE — Patient Instructions (Signed)
Preventative Services:  Health Risk Assessment and Personalized Prevention Plan: Done today Bone Mass Measurements: N/A CVD Screening: Done today Colon Cancer Screening: Up to date Depression Screening: Done today Diabetes Screening: Done today Glaucoma Screening: See your eye doctor Hepatitis B vaccine: N/A Hepatitis C screening: Up to date HIV Screening: up to date Flu Vaccine: up to date Lung cancer Screening: N/A Obesity Screening: Done today Pneumonia Vaccines (2): up to date STI Screening: N/A PSA screening:Done today

## 2022-08-02 NOTE — Assessment & Plan Note (Addendum)
Was to have liver bx in 2022 prior to hep B treatment. Never had this done. Discussed getting him back into GI. He is not interested at this time.

## 2022-08-03 ENCOUNTER — Ambulatory Visit: Payer: Medicare Other | Admitting: Physician Assistant

## 2022-08-03 LAB — CBC WITH DIFFERENTIAL/PLATELET
Basophils Absolute: 0.1 10*3/uL (ref 0.0–0.2)
Basos: 1 %
EOS (ABSOLUTE): 1.4 10*3/uL — ABNORMAL HIGH (ref 0.0–0.4)
Eos: 19 %
Hematocrit: 45.6 % (ref 37.5–51.0)
Hemoglobin: 15 g/dL (ref 13.0–17.7)
Immature Grans (Abs): 0 10*3/uL (ref 0.0–0.1)
Immature Granulocytes: 0 %
Lymphocytes Absolute: 1.9 10*3/uL (ref 0.7–3.1)
Lymphs: 26 %
MCH: 29.9 pg (ref 26.6–33.0)
MCHC: 32.9 g/dL (ref 31.5–35.7)
MCV: 91 fL (ref 79–97)
Monocytes Absolute: 0.7 10*3/uL (ref 0.1–0.9)
Monocytes: 10 %
Neutrophils Absolute: 3.1 10*3/uL (ref 1.4–7.0)
Neutrophils: 44 %
Platelets: 188 10*3/uL (ref 150–450)
RBC: 5.01 x10E6/uL (ref 4.14–5.80)
RDW: 14.2 % (ref 11.6–15.4)
WBC: 7.1 10*3/uL (ref 3.4–10.8)

## 2022-08-03 LAB — COMPREHENSIVE METABOLIC PANEL
ALT: 27 IU/L (ref 0–44)
AST: 26 IU/L (ref 0–40)
Albumin/Globulin Ratio: 2 (ref 1.2–2.2)
Albumin: 4.5 g/dL (ref 3.8–4.8)
Alkaline Phosphatase: 45 IU/L (ref 44–121)
BUN/Creatinine Ratio: 19 (ref 10–24)
BUN: 19 mg/dL (ref 8–27)
Bilirubin Total: 0.4 mg/dL (ref 0.0–1.2)
CO2: 23 mmol/L (ref 20–29)
Calcium: 9.4 mg/dL (ref 8.6–10.2)
Chloride: 106 mmol/L (ref 96–106)
Creatinine, Ser: 1 mg/dL (ref 0.76–1.27)
Globulin, Total: 2.3 g/dL (ref 1.5–4.5)
Glucose: 116 mg/dL — ABNORMAL HIGH (ref 70–99)
Potassium: 4 mmol/L (ref 3.5–5.2)
Sodium: 146 mmol/L — ABNORMAL HIGH (ref 134–144)
Total Protein: 6.8 g/dL (ref 6.0–8.5)
eGFR: 79 mL/min/{1.73_m2} (ref >59)

## 2022-08-03 LAB — PSA: Prostate Specific Ag, Serum: 0.7 ng/mL (ref 0.0–4.0)

## 2022-08-03 LAB — LIPID PANEL W/O CHOL/HDL RATIO
Cholesterol, Total: 175 mg/dL (ref 100–199)
HDL: 57 mg/dL (ref >39)
LDL Chol Calc (NIH): 88 mg/dL (ref 0–99)
Triglycerides: 177 mg/dL — ABNORMAL HIGH (ref 0–149)
VLDL Cholesterol Cal: 30 mg/dL (ref 5–40)

## 2022-08-03 LAB — TSH: TSH: 0.007 u[IU]/mL — ABNORMAL LOW (ref 0.450–4.500)

## 2022-08-03 LAB — PROLACTIN: Prolactin: 17.6 ng/mL (ref 3.6–25.2)

## 2022-08-04 ENCOUNTER — Other Ambulatory Visit: Payer: Self-pay | Admitting: Family Medicine

## 2022-08-04 NOTE — Telephone Encounter (Signed)
Pt went to pharmacy and was able to get all meds except Levothyroxine / please send refill to [facility] ([facility]), [place]

## 2022-08-04 NOTE — Telephone Encounter (Signed)
Requested medications are due for refill today.  yes  Requested medications are on the active medications list.  yes  Last refill. 07/22/2022 #15 0 rf  Future visit scheduled.   yes  Notes to clinic.  Abnormal labs.    Requested Prescriptions  Pending Prescriptions Disp Refills   levothyroxine (SYNTHROID) 125 MCG tablet [Pharmacy Med Name: Levothyroxine Sodium 125 MCG Oral Tablet] 15 tablet 0    Sig: Take 1 tablet by mouth once daily     Endocrinology:  Hypothyroid Agents Failed - 08/04/2022  3:07 PM      Failed - TSH in normal range and within 360 days    TSH  Date Value Ref Range Status  08/02/2022 0.007 (L) 0.450 - 4.500 uIU/mL Final         Passed - Valid encounter within last 12 months    Recent Outpatient Visits           2 days ago Encounter for annual wellness exam in Medicare patient   [place], DO   3 months ago Mixed hyperlipidemia   [facility] Mecum, Erin E, PA-C   9 months ago Primary hypertension   [facility] Vigg, Avanti, MD   1 year ago Need for influenza vaccination   [facility] Vigg, Avanti, MD   1 year ago Essential hypertension   [facility] Vigg, Avanti, MD       Future Appointments             In 6 months Johnson, Barb Merino, DO [employer], [facility]

## 2022-08-08 ENCOUNTER — Telehealth: Payer: Self-pay | Admitting: Family Medicine

## 2022-08-08 NOTE — Telephone Encounter (Signed)
Left message for patient to give our office a call back to discuss Dr.Johnson's recommendations.   OK for [facility]/Nurse Triage to give note if patient calls back.

## 2022-08-08 NOTE — Telephone Encounter (Deleted)
Patient is also requesting a 90day supply instead of a 30 day supply for the medication as well.

## 2022-08-08 NOTE — Telephone Encounter (Signed)
Pt called back, given advice from Dr. Wynetta Emery. Pt verbalized understanding. Pt is ok with Dr. Wynetta Emery advice but asking if can send in 41 DS for insurance. Advised would send back to Dr. Wynetta Emery.

## 2022-08-08 NOTE — Telephone Encounter (Signed)
If Dr. Honor Junes is managing that medicine, he can send in the refill- his tsh was overactive, which is why I dropped his dose. I'm happy to send the results to Dr. Honor Junes if need be

## 2022-08-08 NOTE — Telephone Encounter (Addendum)
Patient states that he seen Dr. Wynetta Emery on last week and his medication dosage was changed from '125mg'$  to '100mg'$   levothyroxine (SYNTHROID) 100 MCG   Patient states that he reached out to Dr. Honor Junes who he has seen for the last 75yr and was advised that the medication should not be changed and needs to go back to '125mg'$ .  Pt is also requesting a 90 day supply of the medication instead of a 30 day supply.  Please advise

## 2022-08-12 MED ORDER — LEVOTHYROXINE SODIUM 100 MCG PO TABS: 100.0000 ug | ORAL_TABLET | Freq: Every day | ORAL | 0 refills | Status: DC

## 2022-08-12 NOTE — Addendum Note (Signed)
Addended by: Valerie Roys on: 08/12/2022 11:29 AM   Modules accepted: Orders

## 2022-08-31 DIAGNOSIS — E221 Hyperprolactinemia: Secondary | ICD-10-CM | POA: Diagnosis not present

## 2022-08-31 DIAGNOSIS — E038 Other specified hypothyroidism: Secondary | ICD-10-CM | POA: Diagnosis not present

## 2022-11-29 DIAGNOSIS — E038 Other specified hypothyroidism: Secondary | ICD-10-CM | POA: Diagnosis not present

## 2022-11-29 DIAGNOSIS — E221 Hyperprolactinemia: Secondary | ICD-10-CM | POA: Diagnosis not present

## 2022-12-14 IMAGING — DX DG CHEST 2V
3 series · 3 of 3 positions shown · non-contrast
Comparison: Chest CT 04/16/2010

CLINICAL DATA: Post COVID cough. Question pneumonia. COVID in
[REDACTED].

EXAM:
CHEST - 2 VIEW

[chest pa (1 of 2)]
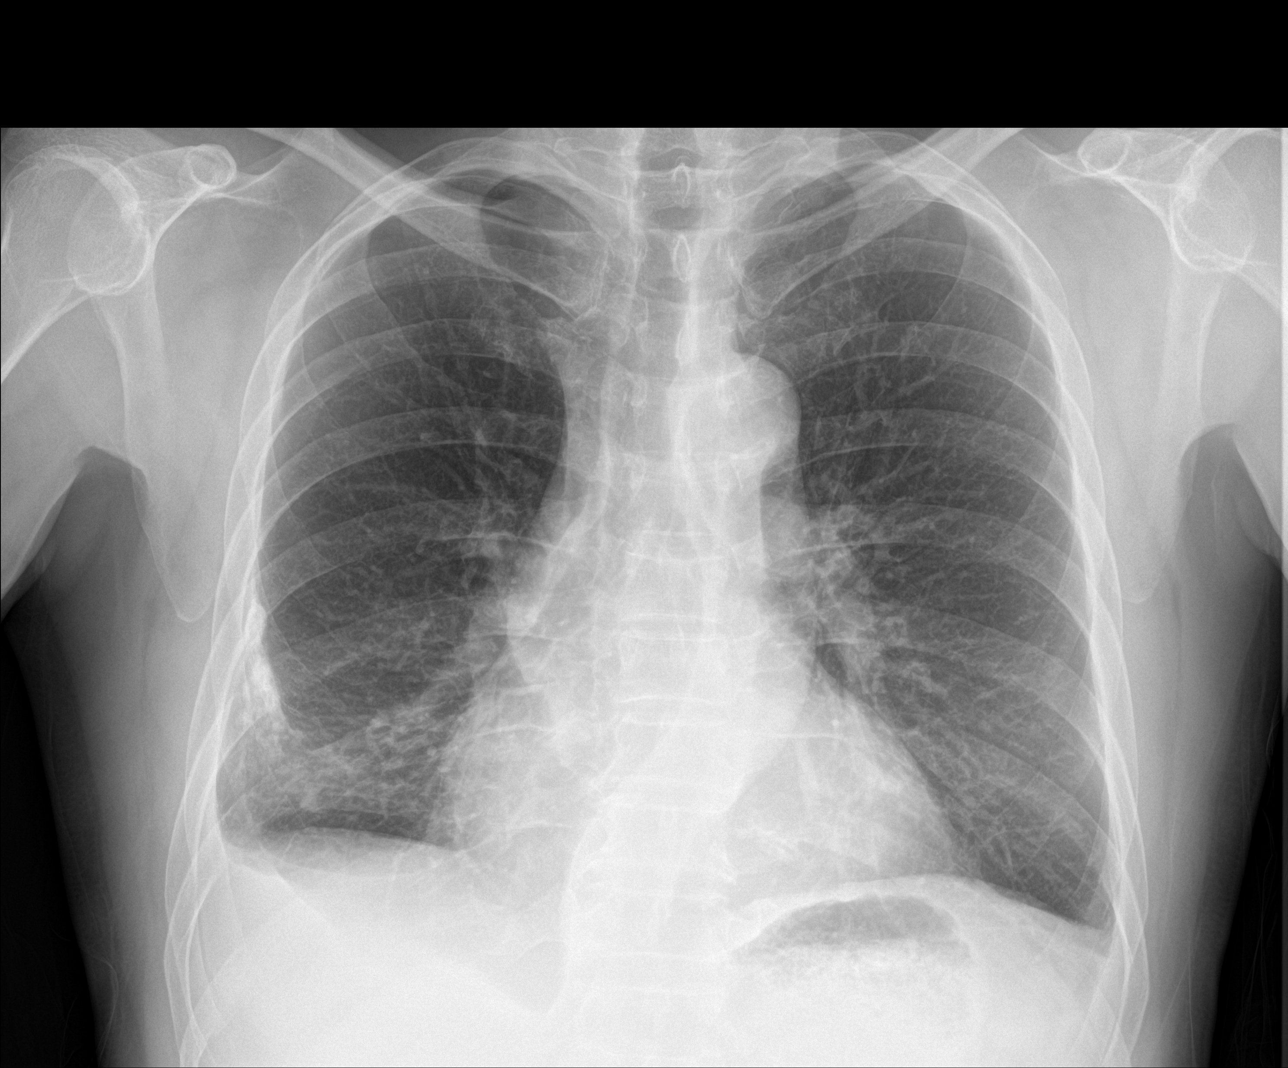

[chest lat]
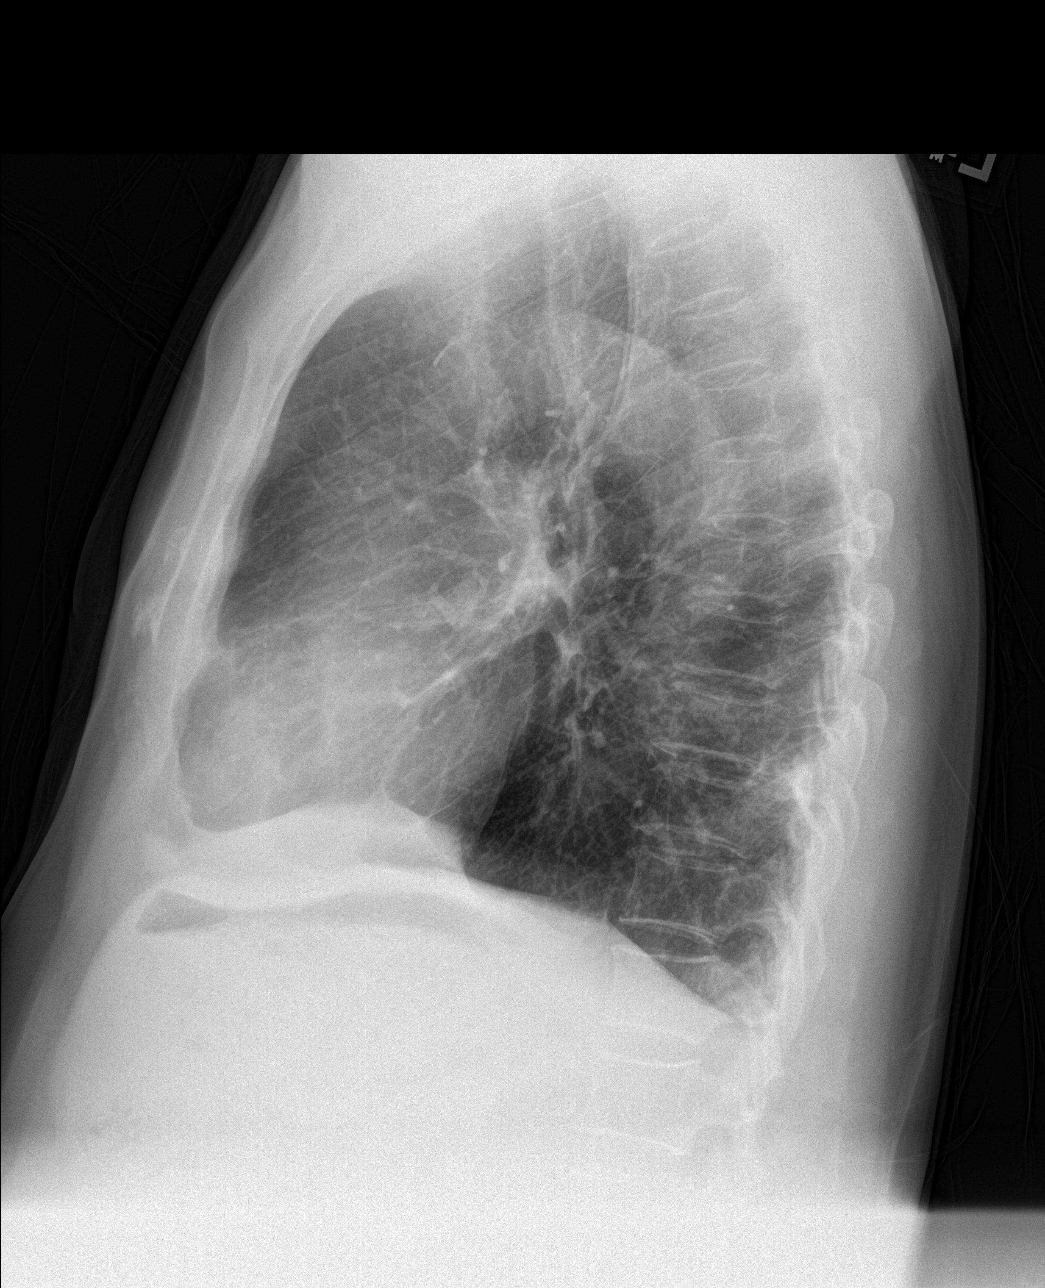

[chest pa (2 of 2)]
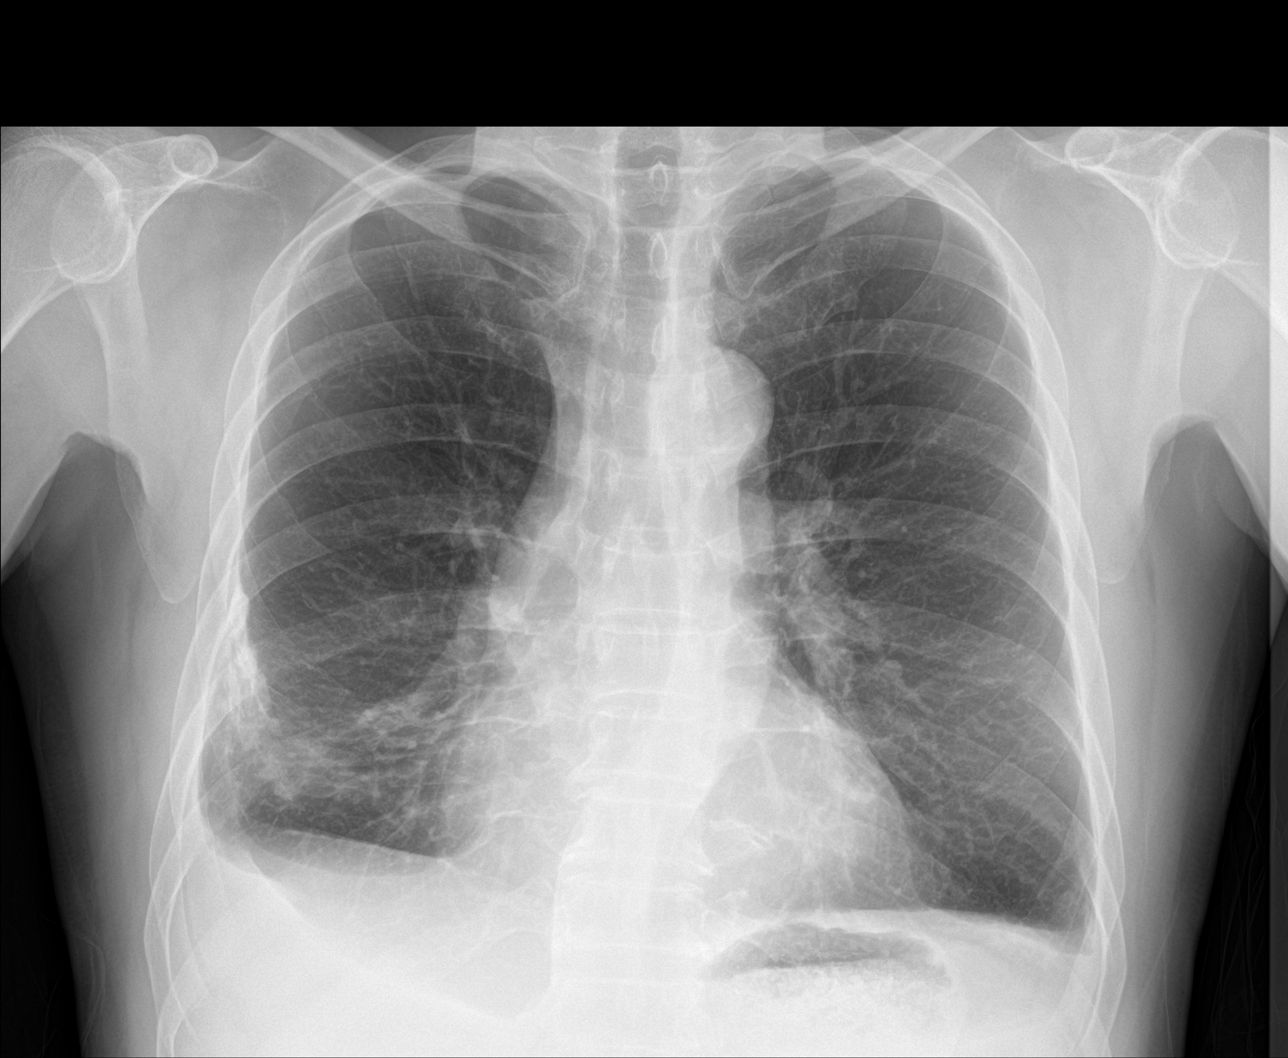

[3 of 3 positions shown; findings below may reference images not displayed]

FINDINGS: Pleuroparenchymal scarring and pleural calcifications at the right
lung base, not significantly changed from remote prior CT. Lungs are
otherwise clear. There is no acute airspace disease. Normal heart
size with mild aortic tortuosity. Aortic atherosclerosis. Mild
hyperinflation, blunting of left costophrenic angle is likely due to
hyperinflation, and similar to prior exam. No pulmonary edema. No
pneumothorax. No acute osseous abnormalities are seen.
IMPRESSION: 1. No acute findings or change from remote CT.
2. Chronic pleuroparenchymal scarring and pleural calcifications at
the right lung base.

## 2023-01-24 ENCOUNTER — Telehealth: Payer: Self-pay | Admitting: Family Medicine

## 2023-01-24 ENCOUNTER — Other Ambulatory Visit: Payer: Self-pay | Admitting: Family Medicine

## 2023-01-24 DIAGNOSIS — E782 Mixed hyperlipidemia: Secondary | ICD-10-CM

## 2023-01-24 DIAGNOSIS — I10 Essential (primary) hypertension: Secondary | ICD-10-CM

## 2023-01-24 NOTE — Telephone Encounter (Signed)
Has appointment next week- does he have enough to make it to his appointment?

## 2023-01-24 NOTE — Telephone Encounter (Signed)
Called patient to follow up to see if he has enough medication to last until his scheduled appointment with Dr Laural Benes. Advised patient to give our office a call back to discuss.   OK for [facility] to clarify information if the patient calls back.

## 2023-01-24 NOTE — Telephone Encounter (Signed)
Requested Prescriptions  Pending Prescriptions Disp Refills   rosuvastatin (CRESTOR) 5 MG tablet [Pharmacy Med Name: Rosuvastatin Calcium 5 MG Oral Tablet] 90 tablet 0    Sig: Take 1 tablet by mouth once daily     Cardiovascular:  Antilipid - Statins 2 Failed - 01/24/2023  4:15 PM      Failed - Lipid Panel in normal range within the last 12 months    Cholesterol, Total  Date Value Ref Range Status  08/02/2022 175 100 - 199 mg/dL Final   LDL Chol Calc (NIH)  Date Value Ref Range Status  08/02/2022 88 0 - 99 mg/dL Final   HDL  Date Value Ref Range Status  08/02/2022 57 >39 mg/dL Final   Triglycerides  Date Value Ref Range Status  08/02/2022 177 (H) 0 - 149 mg/dL Final         Passed - Cr in normal range and within 360 days    Creatinine, Ser  Date Value Ref Range Status  08/02/2022 1.00 0.76 - 1.27 mg/dL Final         Passed - Patient is not pregnant      Passed - Valid encounter within last 12 months    Recent Outpatient Visits           5 months ago Encounter for annual wellness exam in Medicare patient   [place] [facility] [place], Megan P, DO   8 months ago Mixed hyperlipidemia   [place] [facility] Mecum, Oswaldo Conroy, PA-C   1 year ago Primary hypertension   [place] [facility] Vigg, Avanti, MD   1 year ago Need for influenza vaccination   [place] [facility] Vigg, Avanti, MD   1 year ago Essential hypertension   [place] [facility] Vigg, Avanti, MD       Future Appointments             In 1 week Johnson, Oralia Rud, DO [place] [facility]             olmesartan (BENICAR) 40 MG tablet [Pharmacy Med Name: Olmesartan Medoxomil 40 MG Oral Tablet] 90 tablet 0    Sig: Take 1 tablet by mouth once daily     Cardiovascular:  Angiotensin Receptor Blockers Passed - 01/24/2023  4:15 PM      Passed - Cr in normal range and within 180 days    Creatinine, Ser   Date Value Ref Range Status  08/02/2022 1.00 0.76 - 1.27 mg/dL Final         Passed - K in normal range and within 180 days    Potassium  Date Value Ref Range Status  08/02/2022 4.0 3.5 - 5.2 mmol/L Final         Passed - Patient is not pregnant      Passed - Last BP in normal range    BP Readings from Last 1 Encounters:  08/02/22 123/79         Passed - Valid encounter within last 6 months    Recent Outpatient Visits           5 months ago Encounter for annual wellness exam in Medicare patient   [place] [facility] [place], Megan P, DO   8 months ago Mixed hyperlipidemia   [place] [facility] Mecum, Oswaldo Conroy, PA-C   1 year ago Primary hypertension   [place] [facility] Vigg, Avanti,  MD   1 year ago Need for influenza vaccination   [place] [facility] Vigg, Avanti, MD   1 year ago Essential hypertension   [place] [facility] Vigg, Avanti, MD       Future Appointments             In 1 week Johnson, Oralia Rud, DO [place] [facility]             amLODipine (NORVASC) 5 MG tablet [Pharmacy Med Name: amLODIPine Besylate 5 MG Oral Tablet] 90 tablet 0    Sig: Take 1 tablet by mouth once daily     Cardiovascular: Calcium Channel Blockers 2 Passed - 01/24/2023  4:15 PM      Passed - Last BP in normal range    BP Readings from Last 1 Encounters:  08/02/22 123/79         Passed - Last Heart Rate in normal range    Pulse Readings from Last 1 Encounters:  08/02/22 (!) 101         Passed - Valid encounter within last 6 months    Recent Outpatient Visits           5 months ago Encounter for annual wellness exam in Medicare patient   [place] [facility] [place], [place], DO   8 months ago Mixed hyperlipidemia   [place] [facility] Mecum, Oswaldo Conroy, PA-C   1 year ago Primary hypertension   [place] [facility] Vigg,  Avanti, MD   1 year ago Need for influenza vaccination   [place] [facility] Vigg, Avanti, MD   1 year ago Essential hypertension   [place] [facility] Vigg, Avanti, MD       Future Appointments             In 1 week Laural Benes, Oralia Rud, DO  [facility]

## 2023-01-24 NOTE — Telephone Encounter (Signed)
Patient called back, asked patient if he had enough medication to last to up coming OV. Patient states he is out of medication and states he reached out to pharmacy to send in a request for refills. Patient had forgot about future OV 01/31/23, advise patient of that appointment.  Patient verbalized understanding.

## 2023-01-31 ENCOUNTER — Ambulatory Visit (INDEPENDENT_AMBULATORY_CARE_PROVIDER_SITE_OTHER): Payer: No Typology Code available for payment source | Admitting: Family Medicine

## 2023-01-31 ENCOUNTER — Encounter: Payer: Self-pay | Admitting: Family Medicine

## 2023-01-31 VITALS — BP 110/71 | HR 83 | Temp 98.1°F | Wt 160.0 lb

## 2023-01-31 DIAGNOSIS — E221 Hyperprolactinemia: Secondary | ICD-10-CM

## 2023-01-31 DIAGNOSIS — E038 Other specified hypothyroidism: Secondary | ICD-10-CM

## 2023-01-31 DIAGNOSIS — N400 Enlarged prostate without lower urinary tract symptoms: Secondary | ICD-10-CM | POA: Diagnosis not present

## 2023-01-31 DIAGNOSIS — I10 Essential (primary) hypertension: Secondary | ICD-10-CM | POA: Diagnosis not present

## 2023-01-31 DIAGNOSIS — E782 Mixed hyperlipidemia: Secondary | ICD-10-CM | POA: Diagnosis not present

## 2023-01-31 MED ORDER — AMLODIPINE BESYLATE 5 MG PO TABS: 5.0000 mg | ORAL_TABLET | Freq: Every day | ORAL | 1 refills | Status: DC

## 2023-01-31 MED ORDER — OLMESARTAN MEDOXOMIL 40 MG PO TABS: 40.0000 mg | ORAL_TABLET | Freq: Every day | ORAL | 1 refills | Status: DC

## 2023-01-31 MED ORDER — ROSUVASTATIN CALCIUM 5 MG PO TABS: 5.0000 mg | ORAL_TABLET | Freq: Every day | ORAL | 1 refills | Status: DC

## 2023-01-31 NOTE — Assessment & Plan Note (Signed)
Rechecking labs today. Await results. Treat as needed.  °

## 2023-01-31 NOTE — Assessment & Plan Note (Signed)
Under good control on current regimen. Continue current regimen. Continue to monitor. Call with any concerns. Refills given. Labs drawn today.   

## 2023-01-31 NOTE — Progress Notes (Signed)
BP 110/71   Pulse 83   Temp 98.1 F (36.7 C) (Oral)   Wt 160 lb (72.6 kg)   SpO2 99%   BMI 27.45 kg/m    Subjective:    Patient ID: Jerry Frey, male    DOB: 09-04-1950, 72 y.o.   MRN: [id number]  HPI: Jerry Frey is a 72 y.o. male  Chief Complaint  Patient presents with   Hypertension   Hyperlipidemia   Hypothyroidism   HYPOTHYROIDISM Thyroid control status:controlled Satisfied with current treatment? yes Medication side effects: no Medication compliance: excellent compliance Recent dose adjustment:no Fatigue: no Cold intolerance: no Heat intolerance: no Weight gain: no Weight loss: no Constipation: no Diarrhea/loose stools: no Palpitations: no Lower extremity edema: no Anxiety/depressed mood: no  HYPERTENSION / HYPERLIPIDEMIA Satisfied with current treatment? yes Duration of hypertension: chronic BP monitoring frequency: not checking BP medication side effects: no Past BP meds: olmesartan, amlodipine Duration of hyperlipidemia: chronic Cholesterol medication side effects: no Cholesterol supplements: none Past cholesterol medications: crestor Medication compliance: excellent compliance Aspirin: no Recent stressors: no Recurrent headaches: no Visual changes: no Palpitations: no Dyspnea: no Chest pain: no Lower extremity edema: no Dizzy/lightheaded: no   Relevant past medical, surgical, family and social history reviewed and updated as indicated. Interim medical history since our last visit reviewed. Allergies and medications reviewed and updated.  Review of Systems  Constitutional: Negative.   Respiratory: Negative.    Cardiovascular: Negative.   Gastrointestinal: Negative.   Musculoskeletal: Negative.   Neurological: Negative.   Psychiatric/Behavioral: Negative.      Per HPI unless specifically indicated above     Objective:    BP 110/71   Pulse 83   Temp 98.1 F (36.7 C) (Oral)   Wt 160 lb (72.6 kg)   SpO2 99%   BMI  27.45 kg/m   Wt Readings from Last 3 Encounters:  01/31/23 160 lb (72.6 kg)  08/02/22 159 lb (72.1 kg)  05/03/22 156 lb 9.6 oz (71 kg)    Physical Exam Vitals and nursing note reviewed.  Constitutional:      General: He is not in acute distress.    Appearance: Normal appearance. He is normal weight. He is not ill-appearing, toxic-appearing or diaphoretic.  HENT:     Head: Normocephalic and atraumatic.     Right Ear: External ear normal.     Left Ear: External ear normal.     Nose: Nose normal.     Mouth/Throat:     Mouth: Mucous membranes are moist.     Pharynx: Oropharynx is clear.  Eyes:     General: No scleral icterus.       Right eye: No discharge.        Left eye: No discharge.     Extraocular Movements: Extraocular movements intact.     Conjunctiva/sclera: Conjunctivae normal.     Pupils: Pupils are equal, round, and reactive to light.  Cardiovascular:     Rate and Rhythm: Normal rate and regular rhythm.     Pulses: Normal pulses.     Heart sounds: Normal heart sounds. No murmur heard.    No friction rub. No gallop.  Pulmonary:     Effort: Pulmonary effort is normal. No respiratory distress.     Breath sounds: Normal breath sounds. No stridor. No wheezing, rhonchi or rales.  Chest:     Chest wall: No tenderness.  Musculoskeletal:        General: Normal range of motion.     Cervical back: Normal  range of motion and neck supple.  Skin:    General: Skin is warm and dry.     Capillary Refill: Capillary refill takes less than 2 seconds.     Coloration: Skin is not jaundiced or pale.     Findings: No bruising, erythema, lesion or rash.  Neurological:     General: No focal deficit present.     Mental Status: He is alert and oriented to person, place, and time. Mental status is at baseline.  Psychiatric:        Mood and Affect: Mood normal.        Behavior: Behavior normal.        Thought Content: Thought content normal.        Judgment: Judgment normal.      Results for orders placed or performed in visit on 08/02/22  Microscopic Examination   Urine  Result Value Ref Range   WBC, UA 0-5 0 - 5 /hpf   RBC, Urine 0-2 0 - 2 /hpf   Epithelial Cells (non renal) None seen 0 - 10 /hpf   Bacteria, UA None seen None seen/Few  Comprehensive metabolic panel  Result Value Ref Range   Glucose 116 (H) 70 - 99 mg/dL   BUN 19 8 - 27 mg/dL   Creatinine, Ser 1.61 0.76 - 1.27 mg/dL   eGFR 79 >09 UE/AVW/0.98   BUN/Creatinine Ratio 19 10 - 24   Sodium 146 (H) 134 - 144 mmol/L   Potassium 4.0 3.5 - 5.2 mmol/L   Chloride 106 96 - 106 mmol/L   CO2 23 20 - 29 mmol/L   Calcium 9.4 8.6 - 10.2 mg/dL   Total Protein 6.8 6.0 - 8.5 g/dL   Albumin 4.5 3.8 - 4.8 g/dL   Globulin, Total 2.3 1.5 - 4.5 g/dL   Albumin/Globulin Ratio 2.0 1.2 - 2.2   Bilirubin Total 0.4 0.0 - 1.2 mg/dL   Alkaline Phosphatase 45 44 - 121 IU/L   AST 26 0 - 40 IU/L   ALT 27 0 - 44 IU/L  CBC with Differential/Platelet  Result Value Ref Range   WBC 7.1 3.4 - 10.8 x10E3/uL   RBC 5.01 4.14 - 5.80 x10E6/uL   Hemoglobin 15.0 13.0 - 17.7 g/dL   Hematocrit 11.9 14.7 - 51.0 %   MCV 91 79 - 97 fL   MCH 29.9 26.6 - 33.0 pg   MCHC 32.9 31.5 - 35.7 g/dL   RDW 82.9 56.2 - 13.0 %   Platelets 188 150 - 450 x10E3/uL   Neutrophils 44 Not Estab. %   Lymphs 26 Not Estab. %   Monocytes 10 Not Estab. %   Eos 19 Not Estab. %   Basos 1 Not Estab. %   Neutrophils Absolute 3.1 1.4 - 7.0 x10E3/uL   Lymphocytes Absolute 1.9 0.7 - 3.1 x10E3/uL   Monocytes Absolute 0.7 0.1 - 0.9 x10E3/uL   EOS (ABSOLUTE) 1.4 (H) 0.0 - 0.4 x10E3/uL   Basophils Absolute 0.1 0.0 - 0.2 x10E3/uL   Immature Granulocytes 0 Not Estab. %   Immature Grans (Abs) 0.0 0.0 - 0.1 x10E3/uL  Lipid Panel w/o Chol/HDL Ratio  Result Value Ref Range   Cholesterol, Total 175 100 - 199 mg/dL   Triglycerides 865 (H) 0 - 149 mg/dL   HDL 57 >78 mg/dL   VLDL Cholesterol Cal 30 5 - 40 mg/dL   LDL Chol Calc (NIH) 88 0 - 99 mg/dL  PSA   Result Value Ref Range   Prostate Specific  Ag, Serum 0.7 0.0 - 4.0 ng/mL  TSH  Result Value Ref Range   TSH 0.007 (L) 0.450 - 4.500 uIU/mL  Urinalysis, Routine w reflex microscopic  Result Value Ref Range   Specific Gravity, UA 1.015 1.005 - 1.030   pH, UA 7.0 5.0 - 7.5   Color, UA Yellow Yellow   Appearance Ur Clear Clear   Leukocytes,UA Negative Negative   Protein,UA Negative Negative/Trace   Glucose, UA Negative Negative   Ketones, UA Negative Negative   RBC, UA Trace (A) Negative   Bilirubin, UA Negative Negative   Urobilinogen, Ur 0.2 0.2 - 1.0 mg/dL   Nitrite, UA Negative Negative   Microscopic Examination See below:   Microalbumin, Urine Waived  Result Value Ref Range   Microalb, Ur Waived 30 (H) 0 - 19 mg/L   Creatinine, Urine Waived 100 10 - 300 mg/dL   Microalb/Creat Ratio <30 <30 mg/g  Prolactin  Result Value Ref Range   Prolactin 17.6 3.6 - 25.2 ng/mL      Assessment & Plan:   Problem List Items Addressed This Visit       Cardiovascular and Mediastinum   Essential hypertension - Primary    Under good control on current regimen. Continue current regimen. Continue to monitor. Call with any concerns. Refills given. Labs drawn today.        Relevant Medications   amLODipine (NORVASC) 5 MG tablet   olmesartan (BENICAR) 40 MG tablet   rosuvastatin (CRESTOR) 5 MG tablet   Other Relevant Orders   CBC with Differential/Platelet   Comprehensive metabolic panel     Endocrine   Central hypothyroidism    Managed by endocrinology. Continue to follow with them. Call with any concerns.       Relevant Medications   levothyroxine (SYNTHROID) 125 MCG tablet   Other Relevant Orders   CBC with Differential/Platelet   Comprehensive metabolic panel   Hyperprolactinemia (HCC)    Rechecking labs today. Await results. Treat as needed.       Relevant Orders   CBC with Differential/Platelet   Comprehensive metabolic panel   Prolactin     Genitourinary   BPH  (benign prostatic hyperplasia)    Rechecking labs today. Await results. Treat as needed.       Relevant Orders   CBC with Differential/Platelet   Comprehensive metabolic panel   PSA     Other   Mixed hyperlipidemia    Under good control on current regimen. Continue current regimen. Continue to monitor. Call with any concerns. Refills given. Labs drawn today.       Relevant Medications   amLODipine (NORVASC) 5 MG tablet   olmesartan (BENICAR) 40 MG tablet   rosuvastatin (CRESTOR) 5 MG tablet   Other Relevant Orders   CBC with Differential/Platelet   Comprehensive metabolic panel   Lipid Panel w/o Chol/HDL Ratio     Follow up plan: Return in about 6 months (around 08/03/2023) for physical.

## 2023-01-31 NOTE — Assessment & Plan Note (Signed)
Managed by endocrinology. Continue to follow with them. Call with any concerns.

## 2023-02-01 LAB — COMPREHENSIVE METABOLIC PANEL
ALT: 29 IU/L (ref 0–44)
AST: 32 IU/L (ref 0–40)
Albumin: 4.3 g/dL (ref 3.8–4.8)
Alkaline Phosphatase: 39 IU/L — ABNORMAL LOW (ref 44–121)
BUN/Creatinine Ratio: 19 (ref 10–24)
BUN: 20 mg/dL (ref 8–27)
Bilirubin Total: 0.4 mg/dL (ref 0.0–1.2)
CO2: 21 mmol/L (ref 20–29)
Calcium: 9.7 mg/dL (ref 8.6–10.2)
Chloride: 102 mmol/L (ref 96–106)
Creatinine, Ser: 1.05 mg/dL (ref 0.76–1.27)
Globulin, Total: 2.6 g/dL (ref 1.5–4.5)
Glucose: 113 mg/dL — ABNORMAL HIGH (ref 70–99)
Potassium: 3.7 mmol/L (ref 3.5–5.2)
Sodium: 142 mmol/L (ref 134–144)
Total Protein: 6.9 g/dL (ref 6.0–8.5)
eGFR: 75 mL/min/{1.73_m2} (ref >59)

## 2023-02-01 LAB — CBC WITH DIFFERENTIAL/PLATELET
Basophils Absolute: 0.1 10*3/uL (ref 0.0–0.2)
Basos: 1 %
EOS (ABSOLUTE): 1.2 10*3/uL — ABNORMAL HIGH (ref 0.0–0.4)
Eos: 16 %
Hematocrit: 43.8 % (ref 37.5–51.0)
Hemoglobin: 14.6 g/dL (ref 13.0–17.7)
Immature Grans (Abs): 0 10*3/uL (ref 0.0–0.1)
Immature Granulocytes: 0 %
Lymphocytes Absolute: 2.5 10*3/uL (ref 0.7–3.1)
Lymphs: 34 %
MCH: 30.6 pg (ref 26.6–33.0)
MCHC: 33.3 g/dL (ref 31.5–35.7)
MCV: 92 fL (ref 79–97)
Monocytes Absolute: 0.7 10*3/uL (ref 0.1–0.9)
Monocytes: 9 %
Neutrophils Absolute: 3 10*3/uL (ref 1.4–7.0)
Neutrophils: 40 %
Platelets: 170 10*3/uL (ref 150–450)
RBC: 4.77 x10E6/uL (ref 4.14–5.80)
RDW: 13.7 % (ref 11.6–15.4)
WBC: 7.5 10*3/uL (ref 3.4–10.8)

## 2023-02-01 LAB — LIPID PANEL W/O CHOL/HDL RATIO
Cholesterol, Total: 215 mg/dL — ABNORMAL HIGH (ref 100–199)
HDL: 69 mg/dL (ref >39)
LDL Chol Calc (NIH): 118 mg/dL — ABNORMAL HIGH (ref 0–99)
Triglycerides: 164 mg/dL — ABNORMAL HIGH (ref 0–149)
VLDL Cholesterol Cal: 28 mg/dL (ref 5–40)

## 2023-02-01 LAB — PROLACTIN: Prolactin: 12.2 ng/mL (ref 3.6–25.2)

## 2023-02-01 LAB — PSA: Prostate Specific Ag, Serum: 0.7 ng/mL (ref 0.0–4.0)

## 2023-02-03 ENCOUNTER — Encounter: Payer: Self-pay | Admitting: Family Medicine

## 2023-05-31 DIAGNOSIS — H524 Presbyopia: Secondary | ICD-10-CM | POA: Diagnosis not present

## 2023-05-31 DIAGNOSIS — H2513 Age-related nuclear cataract, bilateral: Secondary | ICD-10-CM | POA: Diagnosis not present

## 2023-05-31 DIAGNOSIS — H5203 Hypermetropia, bilateral: Secondary | ICD-10-CM | POA: Diagnosis not present

## 2023-05-31 DIAGNOSIS — H35363 Drusen (degenerative) of macula, bilateral: Secondary | ICD-10-CM | POA: Diagnosis not present

## 2023-08-03 ENCOUNTER — Encounter: Payer: No Typology Code available for payment source | Admitting: Family Medicine

## 2023-08-15 ENCOUNTER — Encounter: Payer: Self-pay | Admitting: Family Medicine

## 2023-08-15 ENCOUNTER — Ambulatory Visit (INDEPENDENT_AMBULATORY_CARE_PROVIDER_SITE_OTHER): Payer: 59 | Admitting: Family Medicine

## 2023-08-15 ENCOUNTER — Ambulatory Visit: Payer: 59 | Admitting: Emergency Medicine

## 2023-08-15 VITALS — BP 130/85 | Ht 63.0 in | Wt 158.0 lb

## 2023-08-15 VITALS — BP 130/85 | HR 73 | Temp 98.5°F | Ht 63.0 in | Wt 158.4 lb

## 2023-08-15 DIAGNOSIS — E038 Other specified hypothyroidism: Secondary | ICD-10-CM | POA: Diagnosis not present

## 2023-08-15 DIAGNOSIS — Z0000 Encounter for general adult medical examination without abnormal findings: Secondary | ICD-10-CM | POA: Diagnosis not present

## 2023-08-15 DIAGNOSIS — E221 Hyperprolactinemia: Secondary | ICD-10-CM

## 2023-08-15 DIAGNOSIS — I10 Essential (primary) hypertension: Secondary | ICD-10-CM | POA: Diagnosis not present

## 2023-08-15 DIAGNOSIS — N400 Enlarged prostate without lower urinary tract symptoms: Secondary | ICD-10-CM | POA: Diagnosis not present

## 2023-08-15 DIAGNOSIS — E782 Mixed hyperlipidemia: Secondary | ICD-10-CM | POA: Diagnosis not present

## 2023-08-15 DIAGNOSIS — Z1211 Encounter for screening for malignant neoplasm of colon: Secondary | ICD-10-CM | POA: Diagnosis not present

## 2023-08-15 LAB — MICROALBUMIN, URINE WAIVED
Creatinine, Urine Waived: 200 mg/dL (ref 10–300)
Microalb, Ur Waived: 80 mg/L — ABNORMAL HIGH (ref 0–19)

## 2023-08-15 MED ORDER — AMLODIPINE BESYLATE 5 MG PO TABS: 5.0000 mg | ORAL_TABLET | Freq: Every day | ORAL | 1 refills | Status: DC

## 2023-08-15 MED ORDER — ROSUVASTATIN CALCIUM 5 MG PO TABS: 5.0000 mg | ORAL_TABLET | Freq: Every day | ORAL | 1 refills | Status: AC

## 2023-08-15 MED ORDER — OLMESARTAN MEDOXOMIL 40 MG PO TABS: 40.0000 mg | ORAL_TABLET | Freq: Every day | ORAL | 1 refills | Status: DC

## 2023-08-15 NOTE — Assessment & Plan Note (Signed)
 Under good control on current regimen. Continue current regimen. Continue to monitor. Call with any concerns. Refills given. Labs drawn today.

## 2023-08-15 NOTE — Patient Instructions (Addendum)
 Jerry Frey , Thank you for taking time to come for your Medicare Wellness Visit. I appreciate your ongoing commitment to your health goals. Please review the following plan we discussed and let me know if I can assist you in the future.   Referrals/Orders/Follow-Ups/Clinician Recommendations: Get the covid vaccine at your convenience. You are due for a colonoscopy in February 2025. You declined a referral today. Please call and schedule at your earliest convenience.  This is a list of the screening recommended for you and due dates:  Health Maintenance  Topic Date Due   COVID-19 Vaccine (5 - 2024-25 season) 04/02/2023   Zoster (Shingles) Vaccine (2 of 2) 05/26/2023   Colon Cancer Screening  09/08/2023   Medicare Annual Wellness Visit  08/14/2024   DTaP/Tdap/Td vaccine (2 - Tdap) 09/28/2024   Pneumonia Vaccine  Completed   Flu Shot  Completed   Hepatitis C Screening  Completed   HPV Vaccine  Aged Out    Advanced directives: (Provided) Advance directive discussed with you today. I have provided a copy for you to complete at home and have notarized. Once this is complete, please bring a copy in to our office so we can scan it into your chart.   Next Medicare Annual Wellness Visit scheduled for next year: Yes, 08/20/24 @ 10:00 am

## 2023-08-15 NOTE — Assessment & Plan Note (Signed)
 Rechecking labs today. Await results. Treat as needed.

## 2023-08-15 NOTE — Progress Notes (Signed)
 Subjective:   Jerry Frey is a 73 y.o. male who presents for Medicare Annual/Subsequent preventive examination.  Visit Complete: In person   Cardiac Risk Factors include: advanced age (>56men, >29 women);male gender;dyslipidemia;hypertension     Objective:    Today's Vitals   08/15/23 0946  BP: 130/85  Weight: 158 lb (71.7 kg)  Height: 5' 3 (1.6 m)   Body mass index is 27.99 kg/m.     08/15/2023    9:53 AM 09/07/2020    8:24 AM 05/29/2020    2:32 PM 05/27/2019    9:45 AM 05/23/2018    9:56 AM 05/23/2018    9:54 AM 05/03/2017    3:21 PM  Advanced Directives  Does Patient Have a Medical Advance Directive? No No No No No No No  Type of Tax Inspector;Living will  Would patient like information on creating a medical advance directive? Yes (MAU/Ambulatory/Procedural Areas - Information given)    Yes (MAU/Ambulatory/Procedural Areas - Information given) Yes (MAU/Ambulatory/Procedural Areas - Information given)     Current Medications (verified) Outpatient Encounter Medications as of 08/15/2023  Medication Sig   amLODipine  (NORVASC ) 5 MG tablet Take 1 tablet (5 mg total) by mouth daily.   levothyroxine  (SYNTHROID ) 125 MCG tablet Take 1 tablet (125 mcg total) by mouth daily.   olmesartan  (BENICAR ) 40 MG tablet Take 1 tablet (40 mg total) by mouth daily.   rosuvastatin  (CRESTOR ) 5 MG tablet Take 1 tablet (5 mg total) by mouth daily. (Patient not taking: Reported on 08/15/2023)   No facility-administered encounter medications on file as of 08/15/2023.    Allergies (verified) Patient has no known allergies.   History: Past Medical History:  Diagnosis Date   Hypertension    Thyroid  disease    Past Surgical History:  Procedure Laterality Date   COLONOSCOPY WITH PROPOFOL  N/A 09/07/2020   Procedure: COLONOSCOPY WITH PROPOFOL ;  Surgeon: Unk Corinn Skiff, MD;  Location: [facility] ENDOSCOPY;  Service: Gastroenterology;  Laterality: N/A;    L4 compression fracture     Family History  Problem Relation Age of Onset   Other Mother        unknown medical history   Other Father        unknown medical history   Social History   Socioeconomic History   Marital status: Married    Spouse name: Amphay   Number of children: 3   Years of education: Not on file   Highest education level: 12th grade  Occupational History   Occupation: retired  Tobacco Use   Smoking status: Former    Current packs/day: 0.00    Average packs/day: 1 pack/day for 20.0 years (20.0 ttl pk-yrs)    Types: Cigarettes    Start date: 08/01/1966    Quit date: 08/01/1986    Years since quitting: 37.0   Smokeless tobacco: Never  Vaping Use   Vaping status: Never Used  Substance and Sexual Activity   Alcohol use: Yes    Alcohol/week: 21.0 standard drinks of alcohol    Types: 21 Glasses of wine per week    Comment: 2-3 beers daily   Drug use: No   Sexual activity: Not on file  Other Topics Concern   Not on file  Social History Narrative   Not on file   Social Drivers of Health   Financial Resource Strain: Low Risk  (08/15/2023)   Overall Financial Resource Strain (CARDIA)    Difficulty of Paying Living  Expenses: Not hard at all  Food Insecurity: No Food Insecurity (08/15/2023)   Hunger Vital Sign    Worried About Running Out of Food in the Last Year: Never true    Ran Out of Food in the Last Year: Never true  Transportation Needs: No Transportation Needs (08/15/2023)   PRAPARE - Administrator, Civil Service (Medical): No    Lack of Transportation (Non-Medical): No  Physical Activity: Inactive (08/15/2023)   Exercise Vital Sign    Days of Exercise per Week: 0 days    Minutes of Exercise per Session: 0 min  Stress: No Stress Concern Present (08/15/2023)   [employer] of Occupational Health - Occupational Stress Questionnaire    Feeling of Stress : Not at all  Social Connections: Moderately Isolated (08/15/2023)   Social Connection  and Isolation Panel [NHANES]    Frequency of Communication with Friends and Family: More than three times a week    Frequency of Social Gatherings with Friends and Family: Once a week    Attends Religious Services: Never    Database Administrator or Organizations: No    Attends Engineer, Structural: Never    Marital Status: Married    Tobacco Counseling Counseling given: Not Answered   Clinical Intake:  Pre-visit preparation completed: Yes  Pain : No/denies pain     BMI - recorded: 27.99 Nutritional Status: BMI 25 -29 Overweight Nutritional Risks: None Diabetes: No  How often do you need to have someone help you when you read instructions, pamphlets, or other written materials from your doctor or pharmacy?: 1 - Never  Interpreter Needed?: No  Information entered by :: Vina Ned, CMA   Activities of Daily Living    08/15/2023    9:47 AM  In your present state of health, do you have any difficulty performing the following activities:  Hearing? 0  Vision? 0  Difficulty concentrating or making decisions? 0  Walking or climbing stairs? 0  Dressing or bathing? 0  Doing errands, shopping? 0  Preparing Food and eating ? N  Using the Toilet? N  In the past six months, have you accidently leaked urine? N  Do you have problems with loss of bowel control? N  Managing your Medications? N  Managing your Finances? N  Housekeeping or managing your Housekeeping? N    Patient Care Team: Vicci Duwaine SQUIBB, DO as PCP - General (Family Medicine) Delores Lonni SAUNDERS, MD as Referring Physician (Orthopedic Surgery) Damian Therisa HERO, MD as Physician Assistant (Endocrinology) Unk Corinn Skiff, MD as Consulting Physician (Gastroenterology)  Indicate any recent Medical Services you may have received from other than [facility] providers in the past year (date may be approximate).     Assessment:   This is a routine wellness examination for Jerry Frey.  Hearing/Vision  screen Hearing Screening - Comments:: No hearing loss Vision Screening - Comments:: Gets eye exams   Goals Addressed             This Visit's Progress    Exercise 3x per week (30 min per time)        Depression Screen    08/15/2023    9:51 AM 01/31/2023    1:13 PM 08/02/2022    1:45 PM 05/03/2022    2:05 PM 10/21/2021    3:30 PM 01/19/2021    1:10 PM 05/29/2020    2:33 PM  PHQ 2/9 Scores  PHQ - 2 Score 0 0 0 0 0 0 0  PHQ- 9 Score  0 0 0 0  0    Fall Risk    08/15/2023    9:54 AM 01/31/2023    1:13 PM 08/02/2022    1:46 PM 05/03/2022    2:05 PM 10/21/2021    3:30 PM  Fall Risk   Falls in the past year? 0 0 0 0 0  Number falls in past yr: 0 0 0 0 0  Injury with Fall? 0 0 0 0 0  Risk for fall due to : No Fall Risks No Fall Risks No Fall Risks No Fall Risks No Fall Risks  Follow up Falls prevention discussed Falls evaluation completed Falls evaluation completed Falls evaluation completed Falls evaluation completed    MEDICARE RISK AT HOME: Medicare Risk at Home Any stairs in or around the home?: Yes If so, are there any without handrails?: No Home free of loose throw rugs in walkways, pet beds, electrical cords, etc?: Yes Adequate lighting in your home to reduce risk of falls?: Yes Life alert?: No Use of a cane, walker or w/c?: No Grab bars in the bathroom?: No Shower chair or bench in shower?: No Elevated toilet seat or a handicapped toilet?: No  TIMED UP AND GO:  Was the test performed?  Yes  Length of time to ambulate 10 feet: 7 sec Gait steady and fast without use of assistive device    Cognitive Function:        08/15/2023    9:55 AM 08/02/2022    2:02 PM 05/29/2020    2:36 PM 05/23/2018    9:55 AM 05/03/2017    3:23 PM  6CIT Screen  What Year? 0 points 0 points 0 points 0 points 0 points  What month? 0 points 0 points 0 points 0 points 0 points  What time? 0 points 0 points 0 points 0 points 0 points  Count back from 20 0 points 0 points 2 points 0 points 0  points  Months in reverse 0 points 0 points 0 points 0 points 0 points  Repeat phrase 4 points 4 points 2 points 6 points 8 points  Total Score 4 points 4 points 4 points 6 points 8 points    Immunizations Immunization History  Administered Date(s) Administered   Fluad Quad(high Dose 65+) 06/05/2020, 04/21/2021, 06/09/2022   Influenza, High Dose Seasonal PF 05/02/2016, 05/23/2018   Influenza-Unspecified 06/30/2014, 05/20/2015   PFIZER(Purple Top)SARS-COV-2 Vaccination 09/26/2019, 10/24/2019, 04/19/2021, 04/19/2021   Pneumococcal Conjugate-13 05/02/2016   Pneumococcal Polysaccharide-23 05/03/2017   Td 09/28/2014   Zoster Recombinant(Shingrix) 03/31/2023    TDAP status: Up to date  Flu Vaccine status: Up to date  Pneumococcal vaccine status: Up to date  Covid-19 vaccine status: Information provided on how to obtain vaccines.   Qualifies for Shingles Vaccine? Yes   Zostavax completed No   Shingrix Completed?: Yes  Screening Tests Health Maintenance  Topic Date Due   INFLUENZA VACCINE  03/02/2023   COVID-19 Vaccine (5 - 2024-25 season) 04/02/2023   Zoster Vaccines- Shingrix (2 of 2) 05/26/2023   Colonoscopy  09/08/2023   Medicare Annual Wellness (AWV)  08/14/2024   DTaP/Tdap/Td (2 - Tdap) 09/28/2024   Pneumonia Vaccine 28+ Years old  Completed   Hepatitis C Screening  Completed   HPV VACCINES  Aged Out    Health Maintenance  Health Maintenance Due  Topic Date Due   INFLUENZA VACCINE  03/02/2023   COVID-19 Vaccine (5 - 2024-25 season) 04/02/2023   Zoster Vaccines- Shingrix (2 of  2) 05/26/2023   Colonoscopy  09/08/2023    Colorectal cancer screening: Type of screening: Colonoscopy. Completed 09/07/20. Repeat every 3 years. Declined referral  Lung Cancer Screening: (Low Dose CT Chest recommended if Age 30-80 years, 20 pack-year currently smoking OR have quit w/in 15years.) does not qualify.   Lung Cancer Screening Referral: n/a  Additional Screening:  Hepatitis C  Screening: does not qualify; Completed 05/02/16  Vision Screening: Recommended annual ophthalmology exams for early detection of glaucoma and other disorders of the eye. Dental Screening: Recommended annual dental exams for proper oral hygiene    Community Resource Referral / Chronic Care Management: CRR required this visit?  No   CCM required this visit?  No     Plan:     I have personally reviewed and noted the following in the patient's chart:   Medical and social history Use of alcohol, tobacco or illicit drugs  Current medications and supplements including opioid prescriptions. Patient is not currently taking opioid prescriptions. Functional ability and status Nutritional status Physical activity Advanced directives List of other physicians Hospitalizations, surgeries, and ER visits in previous 12 months Vitals Screenings to include cognitive, depression, and falls Referrals and appointments  In addition, I have reviewed and discussed with patient certain preventive protocols, quality metrics, and best practice recommendations. A written personalized care plan for preventive services as well as general preventive health recommendations were provided to patient.     Vina Ned, CMA   08/15/2023   After Visit Summary: (In Person-Printed) AVS printed and given to the patient  Nurse Notes:  6 CIT Score - 4 Needs Covid vaccine Patient states he has had both shingrix vaccines Due for colon, declined referral at this time.

## 2023-08-15 NOTE — Progress Notes (Signed)
 BP 130/85   Pulse 73   Temp 98.5 F (36.9 C) (Oral)   Ht 5' 3 (1.6 m)   Wt 158 lb 6.4 oz (71.8 kg)   SpO2 97%   BMI 28.06 kg/m    Subjective:    Patient ID: Jerry Frey, male    DOB: August 13, 1950, 73 y.o.   MRN: [id number]  HPI: Jerry Frey is a 73 y.o. male presenting on 08/15/2023 for comprehensive medical examination. Current medical complaints include:  HYPERTENSION / HYPERLIPIDEMIA Satisfied with current treatment? yes Duration of hypertension: chronic BP monitoring frequency: not checking BP medication side effects: no Past BP meds: amlodipinr, olmesartan  Duration of hyperlipidemia: chronic Cholesterol medication side effects: no Cholesterol supplements: none Past cholesterol medications: crestor  Medication compliance: excellent compliance Aspirin : no Recent stressors: no Recurrent headaches: no Visual changes: no Palpitations: no Dyspnea: no Chest pain: no Lower extremity edema: no Dizzy/lightheaded: no  HYPOTHYROIDISM Thyroid  control status:controlled Satisfied with current treatment? yes Medication side effects: no Medication compliance: excellent compliance Etiology of hypothyroidism:  Recent dose adjustment:no Fatigue: no Cold intolerance: no Heat intolerance: no Weight gain: no Weight loss: no Constipation: no Diarrhea/loose stools: no Palpitations: no Lower extremity edema: no Anxiety/depressed mood: no  BPH BPH status: controlled Satisfied with current treatment?: yes Medication side effects: N/A Medication compliance: excellent compliance Duration: chronic Nocturia: 1/night Urinary frequency:no Incomplete voiding: no Urgency: no Weak urinary stream: no Straining to start stream: no Dysuria: no Onset: gradual Severity: mild  Interim Problems from his last visit: no  Depression Screen done today and results listed below:     08/15/2023   10:32 AM 08/15/2023    9:51 AM 01/31/2023    1:13 PM 08/02/2022    1:45 PM  05/03/2022    2:05 PM  Depression screen PHQ 2/9  Decreased Interest 0 0 0 0 0  Down, Depressed, Hopeless 0 0 0 0 0  PHQ - 2 Score 0 0 0 0 0  Altered sleeping 1  0 0 0  Tired, decreased energy 0  0 0 0  Change in appetite 0  0 0 0  Feeling bad or failure about yourself  0  0 0 0  Trouble concentrating 0  0 0 0  Moving slowly or fidgety/restless 0  0 0 0  Suicidal thoughts 0  0 0 0  PHQ-9 Score 1  0 0 0  Difficult doing work/chores Not difficult at all  Not difficult at all Not difficult at all Not difficult at all    Past Medical History:  Past Medical History:  Diagnosis Date   Hypertension    Thyroid  disease     Surgical History:  Past Surgical History:  Procedure Laterality Date   COLONOSCOPY WITH PROPOFOL  N/A 09/07/2020   Procedure: COLONOSCOPY WITH PROPOFOL ;  Surgeon: Unk Corinn Skiff, MD;  Location: [facility] ENDOSCOPY;  Service: Gastroenterology;  Laterality: N/A;   L4 compression fracture      Medications:  Current Outpatient Medications on File Prior to Visit  Medication Sig   levothyroxine  (SYNTHROID ) 125 MCG tablet Take 1 tablet (125 mcg total) by mouth daily.   No current facility-administered medications on file prior to visit.    Allergies:  No Known Allergies  Social History:  Social History   Socioeconomic History   Marital status: Married    Spouse name: Amphay   Number of children: 3   Years of education: Not on file   Highest education level: 12th grade  Occupational History   Occupation: retired  Tobacco Use   Smoking status: Former    Current packs/day: 0.00    Average packs/day: 1 pack/day for 20.0 years (20.0 ttl pk-yrs)    Types: Cigarettes    Start date: 08/01/1966    Quit date: 08/01/1986    Years since quitting: 37.0   Smokeless tobacco: Never  Vaping Use   Vaping status: Never Used  Substance and Sexual Activity   Alcohol use: Yes    Alcohol/week: 21.0 standard drinks of alcohol    Types: 21 Glasses of wine per week    Comment:  2-3 beers daily   Drug use: No   Sexual activity: Not on file  Other Topics Concern   Not on file  Social History Narrative   Not on file   Social Drivers of Health   Financial Resource Strain: Low Risk  (08/15/2023)   Overall Financial Resource Strain (CARDIA)    Difficulty of Paying Living Expenses: Not hard at all  Food Insecurity: No Food Insecurity (08/15/2023)   Hunger Vital Sign    Worried About Running Out of Food in the Last Year: Never true    Ran Out of Food in the Last Year: Never true  Transportation Needs: No Transportation Needs (08/15/2023)   PRAPARE - Administrator, Civil Service (Medical): No    Lack of Transportation (Non-Medical): No  Physical Activity: Inactive (08/15/2023)   Exercise Vital Sign    Days of Exercise per Week: 0 days    Minutes of Exercise per Session: 0 min  Stress: No Stress Concern Present (08/15/2023)   [employer] of Occupational Health - Occupational Stress Questionnaire    Feeling of Stress : Not at all  Social Connections: Moderately Isolated (08/15/2023)   Social Connection and Isolation Panel [NHANES]    Frequency of Communication with Friends and Family: More than three times a week    Frequency of Social Gatherings with Friends and Family: Once a week    Attends Religious Services: Never    Database Administrator or Organizations: No    Attends Banker Meetings: Never    Marital Status: Married  Catering Manager Violence: Not At Risk (08/15/2023)   Humiliation, Afraid, Rape, and Kick questionnaire    Fear of Current or Ex-Partner: No    Emotionally Abused: No    Physically Abused: No    Sexually Abused: No   Social History   Tobacco Use  Smoking Status Former   Current packs/day: 0.00   Average packs/day: 1 pack/day for 20.0 years (20.0 ttl pk-yrs)   Types: Cigarettes   Start date: 08/01/1966   Quit date: 08/01/1986   Years since quitting: 37.0  Smokeless Tobacco Never   Social History    Substance and Sexual Activity  Alcohol Use Yes   Alcohol/week: 21.0 standard drinks of alcohol   Types: 21 Glasses of wine per week   Comment: 2-3 beers daily    Family History:  Family History  Problem Relation Age of Onset   Other Mother        unknown medical history   Other Father        unknown medical history    Past medical history, surgical history, medications, allergies, family history and social history reviewed with patient today and changes made to appropriate areas of the chart.   Review of Systems  Constitutional: Negative.   HENT: Negative.    Eyes: Negative.   Respiratory: Negative.    Cardiovascular:  Positive for chest  pain. Negative for palpitations, orthopnea, claudication, leg swelling and PND.  Gastrointestinal:  Positive for heartburn. Negative for abdominal pain, blood in stool, constipation, diarrhea, melena, nausea and vomiting.  Genitourinary: Negative.   Musculoskeletal: Negative.   Skin: Negative.   Neurological: Negative.   Endo/Heme/Allergies: Negative.   Psychiatric/Behavioral: Negative.     All other ROS negative except what is listed above and in the HPI.      Objective:    BP 130/85   Pulse 73   Temp 98.5 F (36.9 C) (Oral)   Ht 5' 3 (1.6 m)   Wt 158 lb 6.4 oz (71.8 kg)   SpO2 97%   BMI 28.06 kg/m   Wt Readings from Last 3 Encounters:  08/15/23 158 lb (71.7 kg)  08/15/23 158 lb 6.4 oz (71.8 kg)  01/31/23 160 lb (72.6 kg)    Physical Exam Vitals and nursing note reviewed.  Constitutional:      General: He is not in acute distress.    Appearance: Normal appearance. He is obese. He is not ill-appearing, toxic-appearing or diaphoretic.  HENT:     Head: Normocephalic and atraumatic.     Right Ear: Tympanic membrane, ear canal and external ear normal. There is no impacted cerumen.     Left Ear: Tympanic membrane, ear canal and external ear normal. There is no impacted cerumen.     Nose: Nose normal. No congestion or  rhinorrhea.     Mouth/Throat:     Mouth: Mucous membranes are moist.     Pharynx: Oropharynx is clear. No oropharyngeal exudate or posterior oropharyngeal erythema.  Eyes:     General: No scleral icterus.       Right eye: No discharge.        Left eye: No discharge.     Extraocular Movements: Extraocular movements intact.     Conjunctiva/sclera: Conjunctivae normal.     Pupils: Pupils are equal, round, and reactive to light.  Neck:     Vascular: No carotid bruit.  Cardiovascular:     Rate and Rhythm: Normal rate and regular rhythm.     Pulses: Normal pulses.     Heart sounds: No murmur heard.    No friction rub. No gallop.  Pulmonary:     Effort: Pulmonary effort is normal. No respiratory distress.     Breath sounds: Normal breath sounds. No stridor. No wheezing, rhonchi or rales.  Chest:     Chest wall: No tenderness.  Abdominal:     General: Abdomen is flat. Bowel sounds are normal. There is no distension.     Palpations: Abdomen is soft. There is no mass.     Tenderness: There is no abdominal tenderness. There is no right CVA tenderness, left CVA tenderness, guarding or rebound.     Hernia: No hernia is present.  Genitourinary:    Comments: Genital exam deferred with shared decision making Musculoskeletal:        General: No swelling, tenderness, deformity or signs of injury.     Cervical back: Normal range of motion and neck supple. No rigidity. No muscular tenderness.     Right lower leg: No edema.     Left lower leg: No edema.  Lymphadenopathy:     Cervical: No cervical adenopathy.  Skin:    General: Skin is warm and dry.     Capillary Refill: Capillary refill takes less than 2 seconds.     Coloration: Skin is not jaundiced or pale.     Findings: No  bruising, erythema, lesion or rash.  Neurological:     General: No focal deficit present.     Mental Status: He is alert and oriented to person, place, and time.     Cranial Nerves: No cranial nerve deficit.      Sensory: No sensory deficit.     Motor: No weakness.     Coordination: Coordination normal.     Gait: Gait normal.     Deep Tendon Reflexes: Reflexes normal.  Psychiatric:        Mood and Affect: Mood normal.        Behavior: Behavior normal.        Thought Content: Thought content normal.        Judgment: Judgment normal.     Results for orders placed or performed in visit on 08/15/23  Microalbumin, Urine Waived   Collection Time: 08/15/23 10:20 AM  Result Value Ref Range   Microalb, Ur Waived 80 (H) 0 - 19 mg/L   Creatinine, Urine Waived 200 10 - 300 mg/dL   Microalb/Creat Ratio 30-300 (H) <30 mg/g      Assessment & Plan:   Problem List Items Addressed This Visit       Cardiovascular and Mediastinum   Essential hypertension   Under good control on current regimen. Continue current regimen. Continue to monitor. Call with any concerns. Refills given. Labs drawn today.        Relevant Medications   amLODipine  (NORVASC ) 5 MG tablet   olmesartan  (BENICAR ) 40 MG tablet   rosuvastatin  (CRESTOR ) 5 MG tablet   Other Relevant Orders   Comprehensive metabolic panel   CBC with Differential/Platelet   Microalbumin, Urine Waived (Completed)     Endocrine   Central hypothyroidism   Rechecking labs today. Await results. Treat as needed.       Relevant Orders   Comprehensive metabolic panel   CBC with Differential/Platelet   TSH   Hyperprolactinemia (HCC)   Checking labs today. Await results. Treat as needed.       Relevant Orders   Comprehensive metabolic panel   CBC with Differential/Platelet   Prolactin     Genitourinary   BPH (benign prostatic hyperplasia)   Rechecking labs today. Await results. Treat as needed.      Relevant Orders   Comprehensive metabolic panel   CBC with Differential/Platelet   PSA     Other   Mixed hyperlipidemia   Under good control on current regimen. Continue current regimen. Continue to monitor. Call with any concerns. Refills  given. Labs drawn today.       Relevant Medications   amLODipine  (NORVASC ) 5 MG tablet   olmesartan  (BENICAR ) 40 MG tablet   rosuvastatin  (CRESTOR ) 5 MG tablet   Other Relevant Orders   Comprehensive metabolic panel   CBC with Differential/Platelet   Lipid Panel w/o Chol/HDL Ratio   Other Visit Diagnoses       Routine general medical examination at a health care facility    -  Primary   Vaccines up to date. Screening labs checked today. Cologuard ordered. Continue diet and exercise. Call with any concerns.     Screening for colon cancer       Colonoscopy ordered today. Await results.   Relevant Orders   Ambulatory referral to Gastroenterology        LABORATORY TESTING:  Health maintenance labs ordered today as discussed above.   The natural history of prostate cancer and ongoing controversy regarding screening and potential treatment outcomes of  prostate cancer has been discussed with the patient. The meaning of a false positive PSA and a false negative PSA has been discussed. He indicates understanding of the limitations of this screening test and wishes to proceed with screening PSA testing.   IMMUNIZATIONS:   - Tdap: Tetanus vaccination status reviewed: last tetanus booster within 10 years. - Influenza: Up to date - Pneumovax: Up to date - Prevnar: Up to date - COVID: Refused - HPV: Not applicable - Shingrix vaccine: Up to date  SCREENING: - Colonoscopy: Ordered today  Discussed with patient purpose of the colonoscopy is to detect colon cancer at curable precancerous or early stages   PATIENT COUNSELING:    Sexuality: Discussed sexually transmitted diseases, partner selection, use of condoms, avoidance of unintended pregnancy  and contraceptive alternatives.   Advised to avoid cigarette smoking.  I discussed with the patient that most people either abstain from alcohol or drink within safe limits (<=14/week and <=4 drinks/occasion for males, <=7/weeks and <= 3  drinks/occasion for females) and that the risk for alcohol disorders and other health effects rises proportionally with the number of drinks per week and how often a drinker exceeds daily limits.  Discussed cessation/primary prevention of drug use and availability of treatment for abuse.   Diet: Encouraged to adjust caloric intake to maintain  or achieve ideal body weight, to reduce intake of dietary saturated fat and total fat, to limit sodium intake by avoiding high sodium foods and not adding table salt, and to maintain adequate dietary potassium and calcium  preferably from fresh fruits, vegetables, and low-fat dairy products.    stressed the importance of regular exercise  Injury prevention: Discussed safety belts, safety helmets, smoke detector, smoking near bedding or upholstery.   Dental health: Discussed importance of regular tooth brushing, flossing, and dental visits.   Follow up plan: NEXT PREVENTATIVE PHYSICAL DUE IN 1 YEAR. Return in about 6 months (around 02/12/2024).

## 2023-08-15 NOTE — Assessment & Plan Note (Signed)
 Checking labs today. Await results. Treat as needed.

## 2023-08-16 LAB — CBC WITH DIFFERENTIAL/PLATELET
Basophils Absolute: 0.1 10*3/uL (ref 0.0–0.2)
Basos: 1 %
EOS (ABSOLUTE): 1.1 10*3/uL — ABNORMAL HIGH (ref 0.0–0.4)
Eos: 18 %
Hematocrit: 48.4 % (ref 37.5–51.0)
Hemoglobin: 15.5 g/dL (ref 13.0–17.7)
Immature Grans (Abs): 0 10*3/uL (ref 0.0–0.1)
Immature Granulocytes: 0 %
Lymphocytes Absolute: 1.7 10*3/uL (ref 0.7–3.1)
Lymphs: 29 %
MCH: 30.6 pg (ref 26.6–33.0)
MCHC: 32 g/dL (ref 31.5–35.7)
MCV: 96 fL (ref 79–97)
Monocytes Absolute: 0.5 10*3/uL (ref 0.1–0.9)
Monocytes: 8 %
Neutrophils Absolute: 2.6 10*3/uL (ref 1.4–7.0)
Neutrophils: 44 %
Platelets: 169 10*3/uL (ref 150–450)
RBC: 5.06 x10E6/uL (ref 4.14–5.80)
RDW: 14.1 % (ref 11.6–15.4)
WBC: 6 10*3/uL (ref 3.4–10.8)

## 2023-08-16 LAB — COMPREHENSIVE METABOLIC PANEL
ALT: 25 [IU]/L (ref 0–44)
AST: 23 [IU]/L (ref 0–40)
Albumin: 4.4 g/dL (ref 3.8–4.8)
Alkaline Phosphatase: 47 [IU]/L (ref 44–121)
BUN/Creatinine Ratio: 16 (ref 10–24)
BUN: 14 mg/dL (ref 8–27)
Bilirubin Total: 0.5 mg/dL (ref 0.0–1.2)
CO2: 22 mmol/L (ref 20–29)
Calcium: 8.9 mg/dL (ref 8.6–10.2)
Chloride: 103 mmol/L (ref 96–106)
Creatinine, Ser: 0.85 mg/dL (ref 0.76–1.27)
Globulin, Total: 2.6 g/dL (ref 1.5–4.5)
Glucose: 91 mg/dL (ref 70–99)
Potassium: 4.2 mmol/L (ref 3.5–5.2)
Sodium: 139 mmol/L (ref 134–144)
Total Protein: 7 g/dL (ref 6.0–8.5)
eGFR: 91 mL/min/{1.73_m2} (ref >59)

## 2023-08-16 LAB — PSA: Prostate Specific Ag, Serum: 0.8 ng/mL (ref 0.0–4.0)

## 2023-08-16 LAB — LIPID PANEL W/O CHOL/HDL RATIO
Cholesterol, Total: 231 mg/dL — ABNORMAL HIGH (ref 100–199)
HDL: 61 mg/dL (ref >39)
LDL Chol Calc (NIH): 131 mg/dL — ABNORMAL HIGH (ref 0–99)
Triglycerides: 223 mg/dL — ABNORMAL HIGH (ref 0–149)
VLDL Cholesterol Cal: 39 mg/dL (ref 5–40)

## 2023-08-16 LAB — PROLACTIN: Prolactin: 9.7 ng/mL (ref 3.6–25.2)

## 2023-08-16 LAB — TSH: TSH: 0.03 u[IU]/mL — ABNORMAL LOW (ref 0.450–4.500)

## 2023-08-20 ENCOUNTER — Encounter: Payer: Self-pay | Admitting: Family Medicine

## 2023-08-30 ENCOUNTER — Encounter: Payer: Self-pay | Admitting: *Deleted

## 2023-09-07 ENCOUNTER — Telehealth: Payer: Self-pay | Admitting: *Deleted

## 2023-09-07 ENCOUNTER — Other Ambulatory Visit: Payer: Self-pay | Admitting: *Deleted

## 2023-09-07 ENCOUNTER — Telehealth: Payer: Self-pay | Admitting: Gastroenterology

## 2023-09-07 DIAGNOSIS — Z860100 Personal history of colon polyps, unspecified: Secondary | ICD-10-CM

## 2023-09-07 MED ORDER — NA SULFATE-K SULFATE-MG SULF 17.5-3.13-1.6 GM/177ML PO SOLN: 1.0000 | Freq: Once | ORAL | 0 refills | Status: AC

## 2023-09-07 NOTE — Telephone Encounter (Signed)
 Spoken to patient and we need to schedule his colonoscopy. This is his repeat after the one that was done on 3 years on 09/07/2020.  Colonoscopy schedule with Dr Baldomero Bone on 11/09/2023 at [facility]

## 2023-09-07 NOTE — Telephone Encounter (Signed)
 The patient called in and left a voicemail requesting his to reschedule his procedure. I called her back to let her know that we got her message, and

## 2023-09-07 NOTE — Telephone Encounter (Signed)
 Gastroenterology Pre-Procedure Review  Request Date: 11/09/2023 Requesting Physician: Dr. Unk  PATIENT REVIEW QUESTIONS: The patient responded to the following health history questions as indicated:    1. Are you having any GI issues? no 2. Do you have a personal history of Polyps? yes (last colonoscopy on 09/07/2020 with Dr Unk) 3. Do you have a family history of Colon Cancer or Polyps? no 4. Diabetes Mellitus? no 5. Joint replacements in the past 12 months?no 6. Major health problems in the past 3 months?no 7. Any artificial heart valves, MVP, or defibrillator?no    MEDICATIONS & ALLERGIES:    Patient reports the following regarding taking any anticoagulation/antiplatelet therapy:   Plavix, Coumadin, Eliquis, Xarelto, Lovenox, Pradaxa, Brilinta, or Effient? no Aspirin ? no  Patient confirms/reports the following medications:  Current Outpatient Medications  Medication Sig Dispense Refill   amLODipine  (NORVASC ) 5 MG tablet Take 1 tablet (5 mg total) by mouth daily. 90 tablet 1   levothyroxine  (SYNTHROID ) 125 MCG tablet Take 1 tablet (125 mcg total) by mouth daily.     olmesartan  (BENICAR ) 40 MG tablet Take 1 tablet (40 mg total) by mouth daily. 90 tablet 1   rosuvastatin  (CRESTOR ) 5 MG tablet Take 1 tablet (5 mg total) by mouth daily. 90 tablet 1   No current facility-administered medications for this visit.    Patient confirms/reports the following allergies:  No Known Allergies  No orders of the defined types were placed in this encounter.   AUTHORIZATION INFORMATION Primary Insurance: 1D#: Group #:  Secondary Insurance: 1D#: Group #:  SCHEDULE INFORMATION: Date: 11/09/2023 Time: Location:  [facility]

## 2023-10-09 ENCOUNTER — Telehealth: Payer: Self-pay

## 2023-10-09 NOTE — Telephone Encounter (Signed)
 PT  needs to cancel procedure due to insurance. Pt will call  to reschedule once he has insurance.

## 2023-10-09 NOTE — Telephone Encounter (Signed)
 Spoken to patient.  Confirm with patient that he would like to cancel and to call me when he is ready to reschedule.  Called endo unit and spoke with Theodoro Grist to make the change.

## 2023-10-31 DIAGNOSIS — E221 Hyperprolactinemia: Secondary | ICD-10-CM | POA: Diagnosis not present

## 2023-10-31 DIAGNOSIS — E038 Other specified hypothyroidism: Secondary | ICD-10-CM | POA: Diagnosis not present

## 2023-11-09 ENCOUNTER — Ambulatory Visit: Admit: 2023-11-09 | Payer: Self-pay | Admitting: Gastroenterology

## 2023-11-09 SURGERY — COLONOSCOPY WITH PROPOFOL
Anesthesia: General

## 2024-01-25 ENCOUNTER — Telehealth: Payer: Self-pay

## 2024-01-25 NOTE — Telephone Encounter (Signed)
 Per pt he would like to schedule procedure. Pt had to cancel procedure in April pt is ready now r/s.

## 2024-01-29 NOTE — Telephone Encounter (Signed)
 Attempted to return phone call to patient this morning to reschedule his colonoscopy.  Call was answered but no one said anything.  Will await to see if he calls back again.  Thanks,  [place], CMA

## 2024-01-31 ENCOUNTER — Other Ambulatory Visit: Payer: Self-pay

## 2024-01-31 ENCOUNTER — Telehealth: Payer: Self-pay

## 2024-01-31 DIAGNOSIS — Z860100 Personal history of colon polyps, unspecified: Secondary | ICD-10-CM

## 2024-01-31 MED ORDER — NA SULFATE-K SULFATE-MG SULF 17.5-3.13-1.6 GM/177ML PO SOLN
1.0000 | Freq: Once | ORAL | 0 refills | Status: AC
Start: 2024-01-31 — End: 2024-01-31

## 2024-01-31 NOTE — Telephone Encounter (Signed)
 Error

## 2024-04-22 ENCOUNTER — Other Ambulatory Visit: Payer: Self-pay | Admitting: Family Medicine

## 2024-04-22 DIAGNOSIS — I10 Essential (primary) hypertension: Secondary | ICD-10-CM

## 2024-04-23 NOTE — Telephone Encounter (Signed)
 Requested Prescriptions  Pending Prescriptions Disp Refills   olmesartan  (BENICAR ) 40 MG tablet [Pharmacy Med Name: Olmesartan  Medoxomil 40 MG Oral Tablet] 90 tablet 0    Sig: Take 1 tablet by mouth once daily     Cardiovascular:  Angiotensin Receptor Blockers Failed - 04/23/2024 12:33 PM      Failed - Cr in normal range and within 180 days    Creatinine, Ser  Date Value Ref Range Status  08/15/2023 0.85 0.76 - 1.27 mg/dL Final         Failed - K in normal range and within 180 days    Potassium  Date Value Ref Range Status  08/15/2023 4.2 3.5 - 5.2 mmol/L Final         Failed - Valid encounter within last 6 months    Recent Outpatient Visits   None            Passed - Patient is not pregnant      Passed - Last BP in normal range    BP Readings from Last 1 Encounters:  08/15/23 130/85          amLODipine  (NORVASC ) 5 MG tablet [Pharmacy Med Name: amLODIPine  Besylate 5 MG Oral Tablet] 90 tablet 0    Sig: Take 1 tablet by mouth once daily     Cardiovascular: Calcium  Channel Blockers 2 Failed - 04/23/2024 12:33 PM      Failed - Valid encounter within last 6 months    Recent Outpatient Visits   None            Passed - Last BP in normal range    BP Readings from Last 1 Encounters:  08/15/23 130/85         Passed - Last Heart Rate in normal range    Pulse Readings from Last 1 Encounters:  08/15/23 73

## 2024-04-25 ENCOUNTER — Encounter: Payer: Self-pay | Admitting: Gastroenterology

## 2024-04-25 ENCOUNTER — Encounter
Admission: RE | Disposition: A | Discharge status: Home / Self Care | Payer: Self-pay | Source: Home / Self Care | Attending: Gastroenterology

## 2024-04-25 ENCOUNTER — Other Ambulatory Visit: Payer: Self-pay

## 2024-04-25 ENCOUNTER — Ambulatory Visit
Admission: RE | Admit: 2024-04-25 | Discharge: 2024-04-25 | Disposition: A | Discharge status: Home / Self Care | Attending: Gastroenterology | Admitting: Gastroenterology

## 2024-04-25 ENCOUNTER — Ambulatory Visit: Admitting: Anesthesiology

## 2024-04-25 DIAGNOSIS — K5730 Diverticulosis of large intestine without perforation or abscess without bleeding: Secondary | ICD-10-CM | POA: Diagnosis not present

## 2024-04-25 DIAGNOSIS — D122 Benign neoplasm of ascending colon: Secondary | ICD-10-CM | POA: Diagnosis not present

## 2024-04-25 DIAGNOSIS — D124 Benign neoplasm of descending colon: Secondary | ICD-10-CM | POA: Diagnosis not present

## 2024-04-25 DIAGNOSIS — K635 Polyp of colon: Secondary | ICD-10-CM

## 2024-04-25 DIAGNOSIS — K643 Fourth degree hemorrhoids: Secondary | ICD-10-CM

## 2024-04-25 DIAGNOSIS — Z87891 Personal history of nicotine dependence: Secondary | ICD-10-CM | POA: Diagnosis not present

## 2024-04-25 DIAGNOSIS — I10 Essential (primary) hypertension: Secondary | ICD-10-CM | POA: Diagnosis not present

## 2024-04-25 DIAGNOSIS — Z860101 Personal history of adenomatous and serrated colon polyps: Secondary | ICD-10-CM | POA: Diagnosis not present

## 2024-04-25 DIAGNOSIS — Z1211 Encounter for screening for malignant neoplasm of colon: Secondary | ICD-10-CM | POA: Diagnosis not present

## 2024-04-25 DIAGNOSIS — K641 Second degree hemorrhoids: Secondary | ICD-10-CM | POA: Insufficient documentation

## 2024-04-25 DIAGNOSIS — D123 Benign neoplasm of transverse colon: Secondary | ICD-10-CM | POA: Diagnosis not present

## 2024-04-25 DIAGNOSIS — D125 Benign neoplasm of sigmoid colon: Secondary | ICD-10-CM | POA: Diagnosis not present

## 2024-04-25 DIAGNOSIS — Z860100 Personal history of colon polyps, unspecified: Secondary | ICD-10-CM

## 2024-04-25 HISTORY — PX: POLYPECTOMY: SHX149

## 2024-04-25 HISTORY — PX: COLONOSCOPY: SHX5424

## 2024-04-25 SURGERY — COLONOSCOPY
Anesthesia: General

## 2024-04-25 MED ORDER — SODIUM CHLORIDE 0.9 % IV SOLN: INTRAVENOUS | Status: DC

## 2024-04-25 MED ORDER — DEXMEDETOMIDINE HCL IN NACL 80 MCG/20ML IV SOLN
INTRAVENOUS | Status: DC | PRN
  Administered 2024-04-25: 8 ug via INTRAVENOUS
  Administered 2024-04-25: 12 ug via INTRAVENOUS

## 2024-04-25 MED ORDER — LIDOCAINE HCL (CARDIAC) PF 100 MG/5ML IV SOSY
PREFILLED_SYRINGE | INTRAVENOUS | Status: DC | PRN
  Administered 2024-04-25: 60 mg via INTRAVENOUS

## 2024-04-25 MED ORDER — PROPOFOL 10 MG/ML IV BOLUS
INTRAVENOUS | Status: DC | PRN
  Administered 2024-04-25 (×2): 50 mg via INTRAVENOUS

## 2024-04-25 MED ORDER — PROPOFOL 500 MG/50ML IV EMUL
INTRAVENOUS | Status: DC | PRN
  Administered 2024-04-25: 75 ug/kg/min via INTRAVENOUS

## 2024-04-25 MED ORDER — LIDOCAINE HCL (PF) 2 % IJ SOLN
INTRAMUSCULAR | Status: AC
  Filled 2024-04-25: qty 5

## 2024-04-25 MED ORDER — PROPOFOL 1000 MG/100ML IV EMUL
INTRAVENOUS | Status: AC
  Filled 2024-04-25: qty 100

## 2024-04-25 NOTE — Anesthesia Preprocedure Evaluation (Signed)
 Anesthesia Evaluation  Patient identified by MRN, date of birth, ID band Patient awake    Reviewed: Allergy & Precautions, NPO status , Patient's Chart, lab work & pertinent test results  History of Anesthesia Complications Negative for: history of anesthetic complications  Airway Mallampati: III  TM Distance: <3 FB Neck ROM: full    Dental  (+) Chipped   Pulmonary neg shortness of breath, former smoker   Pulmonary exam normal        Cardiovascular Exercise Tolerance: Good hypertension, Normal cardiovascular exam     Neuro/Psych negative neurological ROS  negative psych ROS   GI/Hepatic negative GI ROS,neg GERD  ,,(+) Hepatitis -  Endo/Other  Hypothyroidism    Renal/GU negative Renal ROS  negative genitourinary   Musculoskeletal   Abdominal   Peds  Hematology negative hematology ROS (+)   Anesthesia Other Findings Past Medical History: No date: Hypertension No date: Thyroid  disease  Past Surgical History: 09/07/2020: COLONOSCOPY WITH PROPOFOL ; N/A     Comment:  Procedure: COLONOSCOPY WITH PROPOFOL ;  Surgeon: Unk Corinn Skiff, MD;  Location: [facility] ENDOSCOPY;  Service:               Gastroenterology;  Laterality: N/A; No date: L4 compression fracture  BMI    Body Mass Index: 25.86 kg/m      Reproductive/Obstetrics negative OB ROS                              Anesthesia Physical Anesthesia Plan  ASA: 2  Anesthesia Plan: General   Post-op Pain Management:    Induction: Intravenous  PONV Risk Score and Plan: Propofol  infusion and TIVA  Airway Management Planned: Natural Airway and Nasal Cannula  Additional Equipment:   Intra-op Plan:   Post-operative Plan:   Informed Consent: I have reviewed the patients History and Physical, chart, labs and discussed the procedure including the risks, benefits and alternatives for the proposed anesthesia with the  patient or authorized representative who has indicated his/her understanding and acceptance.     Dental Advisory Given  Plan Discussed with: Anesthesiologist, CRNA and Surgeon  Anesthesia Plan Comments: (Patient consented for risks of anesthesia including but not limited to:  - adverse reactions to medications - risk of airway placement if required - damage to eyes, teeth, lips or other oral mucosa - nerve damage due to positioning  - sore throat or hoarseness - Damage to heart, brain, nerves, lungs, other parts of body or loss of life  Patient voiced understanding and assent.)        Anesthesia Quick Evaluation

## 2024-04-25 NOTE — H&P (Signed)
 Jerry Copping, MD [facility] [address] [place], [place] [address] Phone:[phone] Fax : [phone]  Primary Care Physician:  Vicci Duwaine SQUIBB, DO Primary Gastroenterologist:  Dr. Copping  Pre-Procedure History & Physical: HPI:  Jerry Frey is a 73 y.o. male is here for an colonoscopy.   Past Medical History:  Diagnosis Date   Hypertension    Thyroid  disease     Past Surgical History:  Procedure Laterality Date   COLONOSCOPY WITH PROPOFOL  N/A 09/07/2020   Procedure: COLONOSCOPY WITH PROPOFOL ;  Surgeon: Unk Corinn Skiff, MD;  Location: [facility] ENDOSCOPY;  Service: Gastroenterology;  Laterality: N/A;   L4 compression fracture      Prior to Admission medications   Medication Sig Start Date End Date Taking? Authorizing Provider  amLODipine  (NORVASC ) 5 MG tablet Take 1 tablet by mouth once daily 04/23/24  Yes Johnson, Megan P, DO  levothyroxine  (SYNTHROID ) 125 MCG tablet Take 1 tablet (125 mcg total) by mouth daily. 01/31/23  Yes Johnson, Megan P, DO  olmesartan  (BENICAR ) 40 MG tablet Take 1 tablet by mouth once daily 04/23/24  Yes Johnson, Megan P, DO  rosuvastatin  (CRESTOR ) 5 MG tablet Take 1 tablet (5 mg total) by mouth daily. 08/15/23  Yes Johnson, Megan P, DO    Allergies as of 01/31/2024   (No Known Allergies)    Family History  Problem Relation Age of Onset   Other Mother        unknown medical history   Other Father        unknown medical history    Social History   Socioeconomic History   Marital status: Married    Spouse name: Amphay   Number of children: 3   Years of education: Not on file   Highest education level: 12th grade  Occupational History   Occupation: retired  Tobacco Use   Smoking status: Former    Current packs/day: 0.00    Average packs/day: 1 pack/day for 20.0 years (20.0 ttl pk-yrs)    Types: Cigarettes    Start date: 08/01/1966    Quit date: 08/01/1986    Years since quitting: 37.7   Smokeless tobacco: Never  Vaping Use    Vaping status: Never Used  Substance and Sexual Activity   Alcohol use: Yes    Alcohol/week: 21.0 standard drinks of alcohol    Types: 21 Glasses of wine per week    Comment: 2-3 beers daily   Drug use: No   Sexual activity: Not on file  Other Topics Concern   Not on file  Social History Narrative   Not on file   Social Drivers of Health   Financial Resource Strain: Low Risk  (08/15/2023)   Overall Financial Resource Strain (CARDIA)    Difficulty of Paying Living Expenses: Not hard at all  Food Insecurity: No Food Insecurity (08/15/2023)   Hunger Vital Sign    Worried About Running Out of Food in the Last Year: Never true    Ran Out of Food in the Last Year: Never true  Transportation Needs: No Transportation Needs (08/15/2023)   PRAPARE - Administrator, Civil Service (Medical): No    Lack of Transportation (Non-Medical): No  Physical Activity: Inactive (08/15/2023)   Exercise Vital Sign    Days of Exercise per Week: 0 days    Minutes of Exercise per Session: 0 min  Stress: No Stress Concern Present (08/15/2023)   [employer] of Occupational Health - Occupational Stress Questionnaire    Feeling  of Stress : Not at all  Social Connections: Moderately Isolated (08/15/2023)   Social Connection and Isolation Panel    Frequency of Communication with Friends and Family: More than three times a week    Frequency of Social Gatherings with Friends and Family: Once a week    Attends Religious Services: Never    Database administrator or Organizations: No    Attends Banker Meetings: Never    Marital Status: Married  Catering manager Violence: Not At Risk (08/15/2023)   Humiliation, Afraid, Rape, and Kick questionnaire    Fear of Current or Ex-Partner: No    Emotionally Abused: No    Physically Abused: No    Sexually Abused: No    Review of Systems: See HPI, otherwise negative ROS  Physical Exam: BP (!) 148/87   Pulse 63   Temp (!) 96.4 F (35.8 C)    Wt 66.2 kg   SpO2 98%   BMI 25.86 kg/m  General:   Alert,  pleasant and cooperative in NAD Head:  Normocephalic and atraumatic. Neck:  Supple; no masses or thyromegaly. Lungs:  Clear throughout to auscultation.    Heart:  Regular rate and rhythm. Abdomen:  Soft, nontender and nondistended. Normal bowel sounds, without guarding, and without rebound.   Neurologic:  Alert and  oriented x4;  grossly normal neurologically.  Impression/Plan: Jerry Frey is here for an colonoscopy to be performed for a history of adenomatous polyps on 2022   Risks, benefits, limitations, and alternatives regarding  colonoscopy have been reviewed with the patient.  Questions have been answered.  All parties agreeable.   Jerry Copping, MD  04/25/2024, 9:46 AM

## 2024-04-25 NOTE — Anesthesia Postprocedure Evaluation (Signed)
 Anesthesia Post Note  Patient: Jerry Frey  Procedure(s) Performed: COLONOSCOPY POLYPECTOMY, INTESTINE  Patient location during evaluation: Endoscopy Anesthesia Type: General Level of consciousness: awake and alert Pain management: pain level controlled Vital Signs Assessment: post-procedure vital signs reviewed and stable Respiratory status: spontaneous breathing, nonlabored ventilation and respiratory function stable Cardiovascular status: blood pressure returned to baseline and stable Postop Assessment: no apparent nausea or vomiting Anesthetic complications: no   No notable events documented.   Last Vitals:  Vitals:   04/25/24 1032 04/25/24 1040  BP: 116/88 114/82  Pulse:  (!) 56  Resp: 17 15  Temp:    SpO2: 100% 100%    Last Pain:  Vitals:   04/25/24 1012  TempSrc: Temporal  PainSc:                  [name]

## 2024-04-25 NOTE — Transfer of Care (Signed)
 Immediate Anesthesia Transfer of Care Note  Patient: Jerry Frey  Procedure(s) Performed: COLONOSCOPY POLYPECTOMY, INTESTINE  Patient Location: PACU  Anesthesia Type:General  Level of Consciousness: sedated  Airway & Oxygen Therapy: Patient Spontanous Breathing  Post-op Assessment: Report given to RN and Post -op Vital signs reviewed and stable  Post vital signs: Reviewed and stable  Last Vitals:  Vitals Value Taken Time  BP    Temp    Pulse    Resp    SpO2      Last Pain:  Vitals:   04/25/24 0927  PainSc: 0-No pain         Complications: No notable events documented.

## 2024-04-25 NOTE — Op Note (Signed)
 [facility] Patient Name: Jerry Frey Procedure Date: 04/25/2024 9:51 AM MRN: [id number] Account #: [id number] Date of Birth: 09/23/1950 Admit Type: Outpatient Age: 73 Room: [facility] ENDO ROOM 3 Gender: Male Note Status: Finalized Instrument Name: Colon Scope [id number] Procedure:             Colonoscopy Indications:           High risk colon cancer surveillance: Personal history                         of colonic polyps Providers:             Rogelia Copping MD, MD Referring MD:          Duwaine MYRTIS Louder (Referring MD) Medicines:             Propofol  per Anesthesia Complications:         No immediate complications. Procedure:             Pre-Anesthesia Assessment:                        - Prior to the procedure, a History and Physical was                         performed, and patient medications and allergies were                         reviewed. The patient's tolerance of previous                         anesthesia was also reviewed. The risks and benefits                         of the procedure and the sedation options and risks                         were discussed with the patient. All questions were                         answered, and informed consent was obtained. Prior                         Anticoagulants: The patient has taken no anticoagulant                         or antiplatelet agents. ASA Grade Assessment: II - A                         patient with mild systemic disease. After reviewing                         the risks and benefits, the patient was deemed in                         satisfactory condition to undergo the procedure.                        After obtaining informed consent, the colonoscope was  passed under direct vision. Throughout the procedure,                         the patient's blood pressure, pulse, and oxygen                         saturations were monitored continuously. The                          Colonoscope was introduced through the anus and                         advanced to the the cecum, identified by appendiceal                         orifice and ileocecal valve. The colonoscopy was                         performed without difficulty. The patient tolerated                         the procedure well. The quality of the bowel                         preparation was excellent. Findings:      The perianal and digital rectal examinations were normal.      Two sessile polyps were found in the ascending colon. The polyps were 3       to 4 mm in size. These polyps were removed with a cold snare. Resection       and retrieval were complete.      A 4 mm polyp was found in the transverse colon. The polyp was sessile.       The polyp was removed with a cold snare. Resection and retrieval were       complete.      A 4 mm polyp was found in the descending colon. The polyp was sessile.       The polyp was removed with a cold snare. Resection and retrieval were       complete.      Two sessile polyps were found in the sigmoid colon. The polyps were 1 to       3 mm in size. These polyps were removed with a cold snare. Resection and       retrieval were complete.      Multiple small-mouthed diverticula were found in the sigmoid colon.      Non-bleeding internal hemorrhoids were found during retroflexion. The       hemorrhoids were Grade II (internal hemorrhoids that prolapse but reduce       spontaneously). Impression:            - Two 3 to 4 mm polyps in the ascending colon, removed                         with a cold snare. Resected and retrieved.                        - One 4 mm polyp in the transverse colon, removed with  a cold snare. Resected and retrieved.                        - One 4 mm polyp in the descending colon, removed with                         a cold snare. Resected and retrieved.                        - Two 1 to 3 mm polyps  in the sigmoid colon, removed                         with a cold snare. Resected and retrieved.                        - Diverticulosis in the sigmoid colon.                        - Non-bleeding internal hemorrhoids. Recommendation:        - Discharge patient to home.                        - Resume previous diet.                        - Continue present medications.                        - Await pathology results.                        - Repeat colonoscopy in 3 years for surveillance. Procedure Code(s):     --- Professional ---                        [id number], Colonoscopy, flexible; with removal of                         tumor(s), polyp(s), or other lesion(s) by snare                         technique Diagnosis Code(s):     --- Professional ---                        Z86.010, Personal history of colonic polyps                        D12.5, Benign neoplasm of sigmoid colon CPT copyright 2022 American Medical Association. All rights reserved. The codes documented in this report are preliminary and upon coder review may  be revised to meet current compliance requirements. Rogelia Copping MD, MD 04/25/2024 10:09:14 AM This report has been signed electronically. Number of Addenda: 0 Note Initiated On: 04/25/2024 9:51 AM Scope Withdrawal Time: 0 hours 7 minutes 43 seconds  Total Procedure Duration: 0 hours 10 minutes 23 seconds  Estimated Blood Loss:  Estimated blood loss: none.      [facility]

## 2024-04-29 ENCOUNTER — Ambulatory Visit: Payer: Self-pay | Admitting: Gastroenterology

## 2024-04-29 LAB — SURGICAL PATHOLOGY

## 2024-07-17 ENCOUNTER — Other Ambulatory Visit: Payer: Self-pay | Admitting: Family Medicine

## 2024-07-17 DIAGNOSIS — I10 Essential (primary) hypertension: Secondary | ICD-10-CM

## 2024-07-20 NOTE — Telephone Encounter (Signed)
 Requested Prescriptions  Pending Prescriptions Disp Refills   amLODipine  (NORVASC ) 5 MG tablet [Pharmacy Med Name: amLODIPine  Besylate 5 MG Oral Tablet] 90 tablet 0    Sig: Take 1 tablet (5 mg total) by mouth daily. OFFICE VISIT NEEDED FOR ADDITIONAL REFILLS     Cardiovascular: Calcium  Channel Blockers 2 Failed - 07/20/2024  7:26 AM      Failed - Valid encounter within last 6 months    Recent Outpatient Visits   None            Passed - Last BP in normal range    BP Readings from Last 1 Encounters:  04/25/24 114/82         Passed - Last Heart Rate in normal range    Pulse Readings from Last 1 Encounters:  04/25/24 (!) 56          olmesartan  (BENICAR ) 40 MG tablet [Pharmacy Med Name: Olmesartan  Medoxomil 40 MG Oral Tablet] 90 tablet 0    Sig: Take 1 tablet (40 mg total) by mouth daily. OFFICE VISIT NEEDED FOR ADDITIONAL REFILLS     Cardiovascular:  Angiotensin Receptor Blockers Failed - 07/20/2024  7:26 AM      Failed - Cr in normal range and within 180 days    Creatinine, Ser  Date Value Ref Range Status  08/15/2023 0.85 0.76 - 1.27 mg/dL Final         Failed - K in normal range and within 180 days    Potassium  Date Value Ref Range Status  08/15/2023 4.2 3.5 - 5.2 mmol/L Final         Failed - Valid encounter within last 6 months    Recent Outpatient Visits   None            Passed - Patient is not pregnant      Passed - Last BP in normal range    BP Readings from Last 1 Encounters:  04/25/24 114/82

## 2024-09-19 ENCOUNTER — Ambulatory Visit
